# Patient Record
Sex: Female | Born: 1975 | Race: White | Hispanic: No | Marital: Married | Smoking: Never smoker
Health system: Southern US, Community
[De-identification: ages and names within clinical notes are randomized; demographics above are authoritative.]

## PROBLEM LIST (undated history)

## (undated) ENCOUNTER — Inpatient Hospital Stay (HOSPITAL_COMMUNITY): Payer: Self-pay

## (undated) DIAGNOSIS — IMO0002 Reserved for concepts with insufficient information to code with codable children: Secondary | ICD-10-CM

## (undated) DIAGNOSIS — O9902 Anemia complicating childbirth: Secondary | ICD-10-CM

## (undated) DIAGNOSIS — D649 Anemia, unspecified: Secondary | ICD-10-CM

## (undated) DIAGNOSIS — R87619 Unspecified abnormal cytological findings in specimens from cervix uteri: Secondary | ICD-10-CM

## (undated) HISTORY — PX: TUBAL LIGATION: SHX77

---
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
## 2003-11-06 HISTORY — PX: CRYOABLATION: SHX1415

## 2003-11-06 HISTORY — PX: COLPOSCOPY: SHX161

## 2009-05-25 ENCOUNTER — Inpatient Hospital Stay (HOSPITAL_COMMUNITY): Admission: AD | Admit: 2009-05-25 | Discharge: 2009-05-27 | Payer: Self-pay | Admitting: Obstetrics

## 2011-02-11 LAB — CBC
HCT: 38.9 % (ref 36.0–46.0)
Hemoglobin: 10.4 g/dL — ABNORMAL LOW (ref 12.0–15.0)
Hemoglobin: 13.8 g/dL (ref 12.0–15.0)
MCHC: 34.6 g/dL (ref 30.0–36.0)
MCV: 92.8 fL (ref 78.0–100.0)
RBC: 3.25 MIL/uL — ABNORMAL LOW (ref 3.87–5.11)
WBC: 11.1 10*3/uL — ABNORMAL HIGH (ref 4.0–10.5)
WBC: 16.1 10*3/uL — ABNORMAL HIGH (ref 4.0–10.5)

## 2011-02-11 LAB — RH IMMUNE GLOB WKUP(>/=20WKS)(NOT WOMEN'S HOSP): Fetal Screen: NEGATIVE

## 2011-03-20 NOTE — Op Note (Signed)
NAMEIMMACULATE, [name]              ACCOUNT NO.:  [id number]   MEDICAL RECORD NO.:  [id number]          PATIENT TYPE:  INP   LOCATION:  9141                          FACILITY:  [facility]   PHYSICIAN:  Lenoard Aden, M.D.DATE OF BIRTH:  11/17/1975   DATE OF PROCEDURE:  05/25/2009  DATE OF DISCHARGE:                               OPERATIVE REPORT   PREOPERATIVE DIAGNOSES:  1. Nonreassuring fetal heart rate tracing with persistent fetal      tachycardia, maternal fever.  2. Maternal exhaustion pushing x2 hours.   POSTOPERATIVE DIAGNOSES:  1. Nonreassuring fetal heart rate tracing with persistent fetal      tachycardia, maternal fever.  2. Maternal exhaustion pushing x2 hours.   PROCEDURE:  Outlet vacuum-assisted vaginal delivery with kiwi cup x2  pulls.  No pop offs.   SURGEON:  Lenoard Aden, MD   ANESTHESIA:  Epidural.   ESTIMATED BLOOD LOSS:  500 mL.   COMPLICATIONS:  None.   DRAINS:  None.   COUNTS:  Correct.  The patient's recovery in good condition.   FINDINGS:  Full-term living female 8 pounds 4 ounces, occiput anterior  position.  No nuchal cord and 3-vessel cord noted.  Placenta delivered  spontaneously intact.  Second-degree midline laceration repaired with a  3-0 Vicryl Rapide.   DESCRIPTION OF THE PROCEDURE:  After being apprised of the risks,  benefits of vacuum assistance include cephalohematoma, scalp laceration,  and intracranial hemorrhage, the kiwi cup was placed, fetal vertex +3 to  +4 station for 2 pulls.  No pop offs, full-term living female, occiput  anterior position, and left less than 45 degrees over second-degree  midline laceration, Apgars 8 and 9.  Cord blood collected.  Placenta  delivered spontaneously intact.  No cervical or vaginal extensions noted.  The repair with a 3-0 Vicryl  Rapide in a standard fashion.  Good hemostasis noted.  Estimated blood  loss as noted.  The patient and mother tolerated the procedure well.  Both recovering in  good condition.      Lenoard Aden, M.D.  Electronically Signed     RJT/MEDQ  D:  05/25/2009  T:  05/26/2009  Job:  [id number]

## 2012-01-29 LAB — OB RESULTS CONSOLE RUBELLA ANTIBODY, IGM: Rubella: IMMUNE

## 2012-01-29 LAB — OB RESULTS CONSOLE GC/CHLAMYDIA
Chlamydia: NEGATIVE
Gonorrhea: NEGATIVE

## 2012-02-18 ENCOUNTER — Other Ambulatory Visit (HOSPITAL_COMMUNITY): Payer: Self-pay | Admitting: Obstetrics

## 2012-02-18 DIAGNOSIS — IMO0001 Reserved for inherently not codable concepts without codable children: Secondary | ICD-10-CM

## 2012-02-19 ENCOUNTER — Encounter (HOSPITAL_COMMUNITY): Payer: Self-pay

## 2012-02-19 ENCOUNTER — Other Ambulatory Visit (HOSPITAL_COMMUNITY): Payer: Self-pay | Admitting: Obstetrics

## 2012-02-19 ENCOUNTER — Ambulatory Visit (HOSPITAL_COMMUNITY)
Admission: RE | Admit: 2012-02-19 | Discharge: 2012-02-19 | Disposition: A | Discharge status: Home / Self Care | Payer: 59 | Source: Ambulatory Visit | Attending: Obstetrics | Admitting: Obstetrics

## 2012-02-19 VITALS — BP 121/7 | Wt 131.0 lb

## 2012-02-19 DIAGNOSIS — IMO0001 Reserved for inherently not codable concepts without codable children: Secondary | ICD-10-CM

## 2012-02-19 DIAGNOSIS — O30009 Twin pregnancy, unspecified number of placenta and unspecified number of amniotic sacs, unspecified trimester: Secondary | ICD-10-CM | POA: Insufficient documentation

## 2012-02-19 DIAGNOSIS — O26849 Uterine size-date discrepancy, unspecified trimester: Secondary | ICD-10-CM | POA: Insufficient documentation

## 2012-02-19 DIAGNOSIS — O09529 Supervision of elderly multigravida, unspecified trimester: Secondary | ICD-10-CM | POA: Insufficient documentation

## 2012-03-04 ENCOUNTER — Ambulatory Visit (HOSPITAL_COMMUNITY)
Admission: RE | Admit: 2012-03-04 | Discharge: 2012-03-04 | Disposition: A | Discharge status: Home / Self Care | Payer: 59 | Source: Ambulatory Visit | Attending: Obstetrics | Admitting: Obstetrics

## 2012-03-04 DIAGNOSIS — O30009 Twin pregnancy, unspecified number of placenta and unspecified number of amniotic sacs, unspecified trimester: Secondary | ICD-10-CM | POA: Insufficient documentation

## 2012-03-04 DIAGNOSIS — O09529 Supervision of elderly multigravida, unspecified trimester: Secondary | ICD-10-CM | POA: Insufficient documentation

## 2012-03-04 DIAGNOSIS — IMO0001 Reserved for inherently not codable concepts without codable children: Secondary | ICD-10-CM

## 2012-03-04 DIAGNOSIS — O26849 Uterine size-date discrepancy, unspecified trimester: Secondary | ICD-10-CM | POA: Insufficient documentation

## 2012-03-18 ENCOUNTER — Other Ambulatory Visit (HOSPITAL_COMMUNITY): Payer: Self-pay | Admitting: Obstetrics

## 2012-03-18 DIAGNOSIS — O26849 Uterine size-date discrepancy, unspecified trimester: Secondary | ICD-10-CM

## 2012-03-18 DIAGNOSIS — O09529 Supervision of elderly multigravida, unspecified trimester: Secondary | ICD-10-CM

## 2012-03-18 DIAGNOSIS — O30009 Twin pregnancy, unspecified number of placenta and unspecified number of amniotic sacs, unspecified trimester: Secondary | ICD-10-CM

## 2012-03-19 ENCOUNTER — Other Ambulatory Visit (HOSPITAL_COMMUNITY): Payer: Self-pay | Admitting: Obstetrics

## 2012-03-19 ENCOUNTER — Ambulatory Visit (HOSPITAL_COMMUNITY)
Admission: RE | Admit: 2012-03-19 | Discharge: 2012-03-19 | Disposition: A | Discharge status: Home / Self Care | Payer: 59 | Source: Ambulatory Visit | Attending: Obstetrics | Admitting: Obstetrics

## 2012-03-19 DIAGNOSIS — O09529 Supervision of elderly multigravida, unspecified trimester: Secondary | ICD-10-CM

## 2012-03-19 DIAGNOSIS — O26849 Uterine size-date discrepancy, unspecified trimester: Secondary | ICD-10-CM

## 2012-03-19 DIAGNOSIS — O30009 Twin pregnancy, unspecified number of placenta and unspecified number of amniotic sacs, unspecified trimester: Secondary | ICD-10-CM | POA: Insufficient documentation

## 2012-04-02 ENCOUNTER — Ambulatory Visit (HOSPITAL_COMMUNITY)
Admission: RE | Admit: 2012-04-02 | Discharge: 2012-04-02 | Disposition: A | Discharge status: Home / Self Care | Payer: 59 | Source: Ambulatory Visit | Attending: Obstetrics | Admitting: Obstetrics

## 2012-04-02 ENCOUNTER — Other Ambulatory Visit (HOSPITAL_COMMUNITY): Payer: Self-pay | Admitting: Obstetrics

## 2012-04-02 VITALS — BP 113/75 | HR 104 | Wt 141.5 lb

## 2012-04-02 DIAGNOSIS — O09529 Supervision of elderly multigravida, unspecified trimester: Secondary | ICD-10-CM | POA: Insufficient documentation

## 2012-04-02 DIAGNOSIS — O26849 Uterine size-date discrepancy, unspecified trimester: Secondary | ICD-10-CM

## 2012-04-02 DIAGNOSIS — Z1389 Encounter for screening for other disorder: Secondary | ICD-10-CM | POA: Insufficient documentation

## 2012-04-02 DIAGNOSIS — O30009 Twin pregnancy, unspecified number of placenta and unspecified number of amniotic sacs, unspecified trimester: Secondary | ICD-10-CM | POA: Insufficient documentation

## 2012-04-02 DIAGNOSIS — Z363 Encounter for antenatal screening for malformations: Secondary | ICD-10-CM | POA: Insufficient documentation

## 2012-04-02 DIAGNOSIS — O358XX0 Maternal care for other (suspected) fetal abnormality and damage, not applicable or unspecified: Secondary | ICD-10-CM | POA: Insufficient documentation

## 2012-04-02 NOTE — Progress Notes (Signed)
Patient seen today  for follow up ultrasound.  See full report in [facility].  Alpha Gula, MD  Monochorionic/diamniotic twin pregnancy  with best dates of 18 +5 weeks Normal amniotic fluid volume x 2; no gross structural anomalies identified A thin, wispy separating membrane is noted  Twin A: Interval growth is appropriate.  Normal anatomic fetal survey. Normal amniotic fluid volume.  No markers for aneuploidy noted.  A fetal bladder was appreciated.  Twin B: A 2-3 week growth lag is appreciated.  Limited views of the fetal heart were noted.  No obvious abnormalies were appreciated.  A fetal bladder is visible.  A marginal cord insertion is appreciated.  No evidence of TTTS noted on today's ultrasound - feel that growth discordance is most likely due to placental insufficiency vs. genetic etiology (chromosomal/ syndromic).  The patient met briefly with our genetics counselor - undecided about cell free fetal DNA testing.  Recommend follow up in 2 weeks - will reevaluate the fetal heart anatomy in Twin B at that time.

## 2012-04-18 ENCOUNTER — Ambulatory Visit (HOSPITAL_COMMUNITY)
Admission: RE | Admit: 2012-04-18 | Discharge: 2012-04-18 | Disposition: A | Discharge status: Home / Self Care | Payer: 59 | Source: Ambulatory Visit | Attending: Obstetrics | Admitting: Obstetrics

## 2012-04-18 VITALS — BP 113/71 | HR 104 | Wt 141.5 lb

## 2012-04-18 DIAGNOSIS — O09529 Supervision of elderly multigravida, unspecified trimester: Secondary | ICD-10-CM | POA: Insufficient documentation

## 2012-04-18 DIAGNOSIS — O26849 Uterine size-date discrepancy, unspecified trimester: Secondary | ICD-10-CM | POA: Insufficient documentation

## 2012-04-18 DIAGNOSIS — O30009 Twin pregnancy, unspecified number of placenta and unspecified number of amniotic sacs, unspecified trimester: Secondary | ICD-10-CM | POA: Insufficient documentation

## 2012-04-18 NOTE — Progress Notes (Signed)
Patient seen today  for follow up ultrasound.  See full report in [facility].  Alpha Gula, MD  Monochorionic/diamniotic twin pregnancy  with best dates of 21 0/7 weeks Normal amniotic fluid volume x 2; no gross structural anomalies identified A thin, wispy separating membrane is noted.  Twin A: Interval growth is appropriate (48th %).  Normal anatomic fetal survey. Normal amniotic fluid volume.  Mild right-sided renal pylectasis is noted.  A fetal bladder was appreciated.  Twin B: A 3-4 week growth lag is appreciated- EFW < 10th%, but some growth noted since previous evaluation.  Somewhat limited views of the fetal heart were noted.  No obvious abnormalies were appreciated.  A fetal bladder is visible.  A marginal cord insertion is appreciated.  No evidence of TTTS noted on today's ultrasound - feel that growth discordance is most likely due to placental insufficiency vs. genetic etiology (chromosomal/ syndromic).  The patient will most likely decline cell free fetal DNA testing.  Recommend follow up in 2 weeks.

## 2012-05-02 ENCOUNTER — Encounter (HOSPITAL_COMMUNITY): Payer: Self-pay

## 2012-05-02 ENCOUNTER — Ambulatory Visit (HOSPITAL_COMMUNITY)
Admission: RE | Admit: 2012-05-02 | Discharge: 2012-05-02 | Disposition: A | Discharge status: Home / Self Care | Payer: 59 | Source: Ambulatory Visit | Attending: Obstetrics | Admitting: Obstetrics

## 2012-05-02 VITALS — BP 127/70 | HR 95 | Wt 144.0 lb

## 2012-05-02 DIAGNOSIS — O09529 Supervision of elderly multigravida, unspecified trimester: Secondary | ICD-10-CM | POA: Insufficient documentation

## 2012-05-02 DIAGNOSIS — O30009 Twin pregnancy, unspecified number of placenta and unspecified number of amniotic sacs, unspecified trimester: Secondary | ICD-10-CM | POA: Insufficient documentation

## 2012-05-02 DIAGNOSIS — O365990 Maternal care for other known or suspected poor fetal growth, unspecified trimester, not applicable or unspecified: Secondary | ICD-10-CM

## 2012-05-02 DIAGNOSIS — O26849 Uterine size-date discrepancy, unspecified trimester: Secondary | ICD-10-CM | POA: Insufficient documentation

## 2012-05-16 ENCOUNTER — Other Ambulatory Visit (HOSPITAL_COMMUNITY): Payer: Self-pay | Admitting: Maternal and Fetal Medicine

## 2012-05-16 ENCOUNTER — Other Ambulatory Visit: Payer: Self-pay | Admitting: Obstetrics

## 2012-05-16 ENCOUNTER — Ambulatory Visit (HOSPITAL_COMMUNITY)
Admission: RE | Admit: 2012-05-16 | Discharge: 2012-05-16 | Disposition: A | Discharge status: Home / Self Care | Payer: 59 | Source: Ambulatory Visit | Attending: Obstetrics | Admitting: Obstetrics

## 2012-05-16 VITALS — BP 119/73 | HR 92 | Wt 151.0 lb

## 2012-05-16 DIAGNOSIS — O365990 Maternal care for other known or suspected poor fetal growth, unspecified trimester, not applicable or unspecified: Secondary | ICD-10-CM

## 2012-05-16 DIAGNOSIS — O30009 Twin pregnancy, unspecified number of placenta and unspecified number of amniotic sacs, unspecified trimester: Secondary | ICD-10-CM

## 2012-05-16 DIAGNOSIS — O26849 Uterine size-date discrepancy, unspecified trimester: Secondary | ICD-10-CM

## 2012-05-16 DIAGNOSIS — O09529 Supervision of elderly multigravida, unspecified trimester: Secondary | ICD-10-CM | POA: Insufficient documentation

## 2012-05-16 NOTE — Progress Notes (Signed)
Patient seen today  for follow up ultrasound.  See full report in [facility].  Alpha Gula, MD  Ms. [name] returns for follow up due to MC/DA twins with severe growth restriction of Twin B.  No anomalies have been identified on Twin B to date, but suspicion is that this may represent a chromosomal / genetic syndrome due to very early growth restriction that was noted as early at [redacted] weeks gestation.  Uteroplacental insufficiency is less likely.  The patient previously declined cell free fetal DNA testing.  We had a long discussion regarding management now that at least one twin is potentially viable.  At this point, with Twin B's estimated fetal weight well-below 500 g it's ability to survive outside of the womb is unlikely.  In the event that Twin B where to expire in-utereo, Twin A would be at significant risk.  She was quoted a 15% risk for demise of twin A, and a 35% risk for abnormal cranial imaging/ neurodevelopmental delays in the event that Twin B were to expire.  This risk occurs almost instantaneously - would not be able to wait for demise of Twin B to move for delivery of Twin A to avoid neurologic insult in the surviving twin.  I explained to the couple that they may be in a position in the future that they would have to decide to move toward delivery for the benefit of Twin A even if it meant almost the certain neonatal demise of Twin B.    Monochorionic/diamniotic twin pregnancy at 25+0 weeks   Amniotic fluid volume subjectively normal x 2 56% growth discordance noted  Twin A: EFW at the 57th %tile.  Normal umbilical artery Doppler studies. Normal interval anatomy  Twin B: EFW < 10th %tile (341 g)  for Twin B; over the past 14 days, gained approximately 100 grams. Elevated umbilical artery Doppler studies noted without evidence of absent or reversed diastolic flow The fetus is active Limited views of the fetal heart were again obtained.  1) Recommend betamethsone series for fetal  lung maturity 2) Contacted Neonatology - recommend consult (outpatient would be preferred)  RE: discuss likelihood of survival at current weights/ gestational age - to assist in determining at what gestational age they would like intervention for Twin A in the event that Twin B were moribund. 3) Will begin 2x weekly Doppler studies in this office.  In the event that absent or reversed flow is noted on Twin B, would recommend admission for continuous monitoring and possible delivery for the benefit of Twin A.

## 2012-05-17 ENCOUNTER — Other Ambulatory Visit (HOSPITAL_COMMUNITY): Payer: Self-pay | Admitting: Maternal and Fetal Medicine

## 2012-05-17 ENCOUNTER — Inpatient Hospital Stay (HOSPITAL_COMMUNITY)
Admission: AD | Admit: 2012-05-17 | Discharge: 2012-05-17 | Disposition: A | Discharge status: Home / Self Care | Payer: 59 | Source: Ambulatory Visit | Attending: Obstetrics | Admitting: Obstetrics

## 2012-05-17 DIAGNOSIS — O4700 False labor before 37 completed weeks of gestation, unspecified trimester: Secondary | ICD-10-CM | POA: Insufficient documentation

## 2012-05-17 DIAGNOSIS — O30009 Twin pregnancy, unspecified number of placenta and unspecified number of amniotic sacs, unspecified trimester: Secondary | ICD-10-CM

## 2012-05-17 DIAGNOSIS — O365990 Maternal care for other known or suspected poor fetal growth, unspecified trimester, not applicable or unspecified: Secondary | ICD-10-CM

## 2012-05-17 MED ORDER — BETAMETHASONE SOD PHOS & ACET 6 (3-3) MG/ML IJ SUSP
12.0000 mg | Freq: Once | INTRAMUSCULAR | Status: AC
  Administered 2012-05-17: 12 mg via INTRAMUSCULAR
  Filled 2012-05-17: qty 2

## 2012-05-20 ENCOUNTER — Ambulatory Visit (HOSPITAL_COMMUNITY)
Admission: RE | Admit: 2012-05-20 | Discharge: 2012-05-20 | Disposition: A | Discharge status: Home / Self Care | Payer: 59 | Source: Ambulatory Visit | Attending: Obstetrics | Admitting: Obstetrics

## 2012-05-20 DIAGNOSIS — O30009 Twin pregnancy, unspecified number of placenta and unspecified number of amniotic sacs, unspecified trimester: Secondary | ICD-10-CM | POA: Insufficient documentation

## 2012-05-20 DIAGNOSIS — O365990 Maternal care for other known or suspected poor fetal growth, unspecified trimester, not applicable or unspecified: Secondary | ICD-10-CM

## 2012-05-20 DIAGNOSIS — O26849 Uterine size-date discrepancy, unspecified trimester: Secondary | ICD-10-CM | POA: Insufficient documentation

## 2012-05-20 DIAGNOSIS — O09529 Supervision of elderly multigravida, unspecified trimester: Secondary | ICD-10-CM | POA: Insufficient documentation

## 2012-05-20 NOTE — Progress Notes (Signed)
Patient seen for follow up for Doppler studies.  See report in [facility].  Alpha Gula, MD  Monochorionic/diamniotic twin pregnancy at 25+4 weeks   Amniotic fluid volume subjectively normal x 2  Twin A: Fetus is active. Normal umbilical artery Doppler studies.   Twin B: Elevated umbilical artery Doppler studies noted without evidence of absent or reversed diastolic flow The fetus is active  Continue 2x weekly umbilical artery Doppler studies.  Follow up growth scan in 2 weeks. Patient to meet with NICU for counseling Would recommend admission should Twin B develop absent end-diastolic flow - will continue outpatient observation

## 2012-05-22 ENCOUNTER — Other Ambulatory Visit (HOSPITAL_COMMUNITY): Payer: Self-pay | Admitting: Obstetrics

## 2012-05-22 DIAGNOSIS — O30009 Twin pregnancy, unspecified number of placenta and unspecified number of amniotic sacs, unspecified trimester: Secondary | ICD-10-CM

## 2012-05-23 ENCOUNTER — Encounter (HOSPITAL_COMMUNITY): Payer: Self-pay | Admitting: *Deleted

## 2012-05-23 ENCOUNTER — Ambulatory Visit (HOSPITAL_COMMUNITY)
Admission: RE | Admit: 2012-05-23 | Discharge: 2012-05-23 | Disposition: A | Discharge status: Home / Self Care | Payer: 59 | Source: Ambulatory Visit | Attending: Obstetrics | Admitting: Obstetrics

## 2012-05-23 ENCOUNTER — Inpatient Hospital Stay (HOSPITAL_COMMUNITY)
Admission: AD | Admit: 2012-05-23 | Discharge: 2012-05-30 | DRG: 782 | Disposition: A | Discharge status: Home / Self Care | Payer: 59 | Source: Ambulatory Visit | Attending: Obstetrics and Gynecology | Admitting: Obstetrics and Gynecology

## 2012-05-23 DIAGNOSIS — O09529 Supervision of elderly multigravida, unspecified trimester: Secondary | ICD-10-CM | POA: Insufficient documentation

## 2012-05-23 DIAGNOSIS — O26849 Uterine size-date discrepancy, unspecified trimester: Secondary | ICD-10-CM | POA: Insufficient documentation

## 2012-05-23 DIAGNOSIS — O30039 Twin pregnancy, monochorionic/diamniotic, unspecified trimester: Secondary | ICD-10-CM | POA: Insufficient documentation

## 2012-05-23 DIAGNOSIS — O30009 Twin pregnancy, unspecified number of placenta and unspecified number of amniotic sacs, unspecified trimester: Secondary | ICD-10-CM | POA: Insufficient documentation

## 2012-05-23 DIAGNOSIS — O365990 Maternal care for other known or suspected poor fetal growth, unspecified trimester, not applicable or unspecified: Principal | ICD-10-CM | POA: Diagnosis present

## 2012-05-23 DIAGNOSIS — IMO0002 Reserved for concepts with insufficient information to code with codable children: Secondary | ICD-10-CM | POA: Diagnosis present

## 2012-05-23 HISTORY — DX: Reserved for concepts with insufficient information to code with codable children: IMO0002

## 2012-05-23 HISTORY — DX: Unspecified abnormal cytological findings in specimens from cervix uteri: R87.619

## 2012-05-23 HISTORY — DX: Anemia, unspecified: D64.9

## 2012-05-23 LAB — CBC
HCT: 35.2 % — ABNORMAL LOW (ref 36.0–46.0)
MCH: 30.1 pg (ref 26.0–34.0)
MCV: 89.8 fL (ref 78.0–100.0)
RDW: 12.6 % (ref 11.5–15.5)
WBC: 9.8 10*3/uL (ref 4.0–10.5)

## 2012-05-23 LAB — DIFFERENTIAL
Basophils Absolute: 0 10*3/uL (ref 0.0–0.1)
Eosinophils Absolute: 0.1 10*3/uL (ref 0.0–0.7)
Eosinophils Relative: 1 % (ref 0–5)
Lymphocytes Relative: 15 % (ref 12–46)
Lymphs Abs: 1.5 10*3/uL (ref 0.7–4.0)
Monocytes Absolute: 0.8 10*3/uL (ref 0.1–1.0)

## 2012-05-23 LAB — ABO/RH: ABO/RH(D): O NEG

## 2012-05-23 MED ORDER — ZOLPIDEM TARTRATE 5 MG PO TABS
5.0000 mg | ORAL_TABLET | Freq: Every evening | ORAL | Status: DC | PRN
  Administered 2012-05-23: 5 mg via ORAL
  Filled 2012-05-23: qty 1

## 2012-05-23 MED ORDER — DOCUSATE SODIUM 100 MG PO CAPS
100.0000 mg | ORAL_CAPSULE | Freq: Every day | ORAL | Status: DC
  Administered 2012-05-23 – 2012-05-30 (×8): 100 mg via ORAL
  Filled 2012-05-23 (×9): qty 1

## 2012-05-23 MED ORDER — PRENATAL MULTIVITAMIN CH
1.0000 | ORAL_TABLET | Freq: Every day | ORAL | Status: DC
  Administered 2012-05-24 – 2012-05-30 (×7): 1 via ORAL
  Filled 2012-05-23 (×7): qty 1

## 2012-05-23 MED ORDER — ACETAMINOPHEN 325 MG PO TABS: 650.0000 mg | ORAL_TABLET | ORAL | Status: DC | PRN

## 2012-05-23 MED ORDER — CALCIUM CARBONATE ANTACID 500 MG PO CHEW: 2.0000 | CHEWABLE_TABLET | ORAL | Status: DC | PRN

## 2012-05-23 MED ORDER — SODIUM CHLORIDE 0.9 % IJ SOLN: 3.0000 mL | Freq: Two times a day (BID) | INTRAMUSCULAR | Status: DC

## 2012-05-23 NOTE — ED Notes (Signed)
Antenatal charge nurse notified of admission.  Pt taken to MAU desk for registration.

## 2012-05-23 NOTE — Progress Notes (Signed)
Patient seen for follow up Doppler studies.  See report in [facility].  Alpha Gula, MD  Monochorionic/diamniotic twin pregnancy at 58 0/7 weeks   Amniotic fluid volume subjectively normal x 2  Twin A: Fetus is active. Normal umbilical artery Doppler studies.   Twin B: Absent end-diastolic flow noted on umbilical artery Doppler studies; no evidence of reversed flow The fetus is active  Recommend admission - NICU consult, daily umbilical artery Doppler studies. Patient has completed course of betamethasone for fetal lung maturity Would move toward delivery if Twin B shows evidence of reversed diastolic flow on umbilical artery Dopplers.    Findings were discussed with Dr. Billy Coast - plan admission to antepartum unit.

## 2012-05-23 NOTE — H&P (Signed)
[name], [name]              ACCOUNT NO.:  [id number]  MEDICAL RECORD NO.:  [id number]  LOCATION:  9151                          FACILITY:  [facility]  PHYSICIAN:  Lenoard Aden, M.D.DATE OF BIRTH:  July 21, 1976  DATE OF ADMISSION:  05/23/2012 DATE OF DISCHARGE:                             HISTORY & PHYSICAL   CHIEF COMPLAINT:  Twin pregnancy with absent end-diastolic flow and severe IUGR of twin B.  HISTORY OF PRESENT ILLNESS:  A 36 year old white female, G3, P1 with history of vacuum-assisted vaginal delivery x1 with known monochorionic diamniotic twins, now with absent end-diastolic flow and severe IUGR of B for admission, continuous monitoring, and daily Doppler flow studies.  ALLERGIES:  She has no known drug allergies.  MEDICATIONS:  Prenatal vitamins.  SOCIAL HISTORY:  She is a nonsmoker, nondrinker.  She denies domestic or physical violence.  FAMILY HISTORY:  Diabetes, heart disease, chronic hypertension, colon cancer.  She has a previous history of vaginal delivery and TAB in 1994.  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  Prenatal course complicated by monochorionic diamniotic twins with poor growth of B, and now new-onset absent end-diastolic flow.  No reversal noted per MFM, Dr. Harlon Flor.  He recommends admission at this time.  PHYSICAL EXAMINATION:  GENERAL:  She is a well-developed, well- nourished, white female, in no acute distress.  Height of 64 inches, weight of 152 pounds. HEENT:  Normal. NECK:  Supple.  Full range of motion. LUNGS:  Clear to auscultation. ABDOMEN:  Soft, gravid, nontender.  No CVA tenderness. EXTREMITIES:  No cords. NEUROLOGIC:  Nonfocal. SKIN:  Intact. PELVIC:  Deferred.  NSTs pending.  Doppler flow studies were performed today by MFM as noted.  IMPRESSION:  A 26-week twin, monochorionic diamniotic gestation with absent end-diastolic flow and severe IUGR of twin B.  PLAN:  We  will be admit, continuous monitoring, daily Dopplers studies. We will consider possibly not at this time, though magnesium for neuro prophylaxis.  The patient is betamethasone complete.  Daily consultation with MFM is discussed.  NICU consult at this time.  The patient acknowledges the plan and the possibility that early delivery will potentially involve the demise of twin B to potentially increase survival chances of twin A.  Delivery likely to be by C-section.  The patient acknowledges these risks, benefits and she will proceed.     Lenoard Aden, M.D.     RJT/MEDQ  D:  05/23/2012  T:  05/23/2012  Job:  [id number]

## 2012-05-23 NOTE — Progress Notes (Signed)
UR Chart review completed.  

## 2012-05-23 NOTE — Progress Notes (Signed)
[name] is a 36 y.o. G3P1011 at [redacted]w[redacted]d by LMP admitted for Severe IUGR and AEDF on Twin B with Mono C Di A twin gestation.  Subjective: No complaints.  Objective: BP 128/85  Pulse 97  Temp 98.6 F (37 C) (Oral)  Resp 18  Ht 5\' 4"  (1.626 m)  Wt 69.854 kg (154 lb)  BMI 26.43 kg/m2  LMP 11/23/2011    See Dictated H&P  FHT:  pending UC:   pending SVE:    na  Labs: pending  Assessment / Plan: 26 week Mono C Di A IUP Severe IUGR with AEDF on UAD Twin B  Labor: na Preeclampsia:  na Fetal Wellbeing:  na Pain Control:  na I/D:  n/a Anticipated MOD:  Csection for Delivery. Will deliver for reversal EDF Discussed with Dr. Claudean Severance (MFM) NICU consult today.  Robben Jagiello J 05/23/2012, 11:04 AM

## 2012-05-23 NOTE — Consult Note (Signed)
Neonatology Consult Note:  I was asked by Dr. Billy Coast to met with Mrs. Corral and her husband.  Mrs. Chowning is at 26 wks currently with preg complicated by mon/di twins with severe IUGR for twin B (estimated weight 400g) while twin A is estimated at 700g.  Pt admitted today due to elevated dopplers.  Has received BMZ 7/12 and 7/13.  We discussed morbidity/mortality at this gestional age.  While national mortality at this gestation age is 80-85% we discussed that due to severe IUGR, baby B will have a much lower chance of survival that will in part be dictated by the ability to place an endotracheal tube due to the baby's limited weight.  We also discussed the higher mortality / morbidity for a severely growth restricted baby in the NICU.   We delivery room resuscitation, including intubation and surfactant in DR.  Discussed mechanical ventilation and risk for chronic lung disease, risk for IVH with potential for motor / cognitive deficits, ROP, NEC, sepsis, as well as temperature instability and feeding immaturity.  Discussed NG / OG feeds, benefits of MBM in reducing incidence of NEC.   Discussed likely length of stay. Thank you for allowing [place] to participate in her care.  Please call with questions.  John Giovanni, DO  Neonatologist

## 2012-05-24 ENCOUNTER — Inpatient Hospital Stay (HOSPITAL_COMMUNITY): Payer: 59

## 2012-05-24 DIAGNOSIS — O30039 Twin pregnancy, monochorionic/diamniotic, unspecified trimester: Secondary | ICD-10-CM | POA: Diagnosis present

## 2012-05-24 DIAGNOSIS — IMO0002 Reserved for concepts with insufficient information to code with codable children: Secondary | ICD-10-CM | POA: Diagnosis present

## 2012-05-24 NOTE — Progress Notes (Signed)
MFM note  Ultrasound images reviewed - AEDF noted on twin B but no evidence of reversed flow.  Twin A - within normal limits. No events over night  No decelerations for either Twin A or B on continuous monitoring  Recommend continued daily Doppler studies, fetal monitoring  Maria Gula, MD

## 2012-05-24 NOTE — Progress Notes (Signed)
Patient ID: [name], female   DOB: 1975-12-05, 35 y.o.   MRN: [id number] HD#[id number] 26 1/7 weeks Mono C Di A twin gestation  S: No complaints. No contractions , bleeding or LOF Good FM No CP or SOB  O: BP 126/79  Pulse 100  Temp 97.9 F (36.6 C) (Oral)  Resp 20  Ht 5\' 4"  (1.626 m)  Wt 69.854 kg (154 lb)  BMI 26.43 kg/m2  LMP 11/23/2011  Lgs: CTA CV: RRR ABD: Soft , gravid , NT Neg CVAT EXT: Neg c/c/e Skin: intact Neuro: nonfocal VE: deferred  FHTs 150-160 good BTBV, No decels for A and B Preliminary report today = AEDF on B with no evidence of reversal  A: 26 1/7 weeks Mono Chorionic Di Amniotic Twin Gestation Severe IUGR with AEDF on UAD Twin B  P: NST x 2 hrs q shift Daily UAD Delivery for reversal EDF B.

## 2012-05-25 ENCOUNTER — Inpatient Hospital Stay (HOSPITAL_COMMUNITY): Payer: 59

## 2012-05-25 NOTE — Progress Notes (Signed)
Patient ID: NATAKI MCCRUMB, female   DOB: 03/31/1976, 36 y.o.   MRN: [id number] HD#3  26 2/7 weeks Mono C Di A twin gestation   S:  No complaints.  No contractions , bleeding or LOF  Good FM  No CP or SOB   O:  BP 122/70  Pulse 90  Temp 97.8 F (36.6 C) (Oral)  Resp 18  Ht 5\' 4"  (1.626 m)  Wt 69.854 kg (154 lb)  BMI 26.43 kg/m2  LMP 11/23/2011   Head: NCAT Neck: supple with FROM  Lgs: CTA  CV: RRR  ABD: Soft , gravid , NT  Neg CVAT  EXT: Neg c/c/e  Skin: intact  Neuro: nonfocal  VE: deferred   FHTs 140-160 good BTBV, No decels for A and B  Preliminary report today = AEDF on B with no evidence of reversal  Nl UAD twin A  A:  26 2/7 weeks  Mono Chorionic Di Amniotic Twin Gestation  Severe IUGR with AEDF on UAD Twin B   P:  NST x 2 hrs q shift  Daily UAD  Sono for interval growth on 7/26 Delivery for reversal EDF B.

## 2012-05-25 NOTE — Progress Notes (Signed)
MFM note  No events overnight.  Both fetuses active. No decelerations on fetal monitoring for Twin A or B  Reviewed ultrasound images -  Intermittently AEDF on umbilical artery Dopplers on Twin B Normal umbilical artery Dopplers on Twin A  A/P: MC/DA twins at 26 2/7 weeks Severe IUGR of Twin B (EFW approx 400 g by ultrasound last week) - now with AEDF on umbilical artery Dopplers s/p course of betamethasone  - Continue daily Doppler studies  - Follow up growth scan on 7/26  Alpha Gula, MD

## 2012-05-25 NOTE — Progress Notes (Signed)
RN at bedside readjusting cardio after pt moving in the bed.  Lots of FM felt by pt as well as Charity fundraiser while at bedside

## 2012-05-26 ENCOUNTER — Inpatient Hospital Stay (HOSPITAL_COMMUNITY)
Admit: 2012-05-26 | Discharge: 2012-05-26 | Disposition: A | Discharge status: Home / Self Care | Payer: 59 | Attending: Obstetrics | Admitting: Obstetrics

## 2012-05-26 NOTE — Progress Notes (Signed)
Patient ID: [name], female   DOB: February 17, 1976, 36 y.o.   MRN: [id number] HD#4  26 3/7 weeks Mono C Di A twin gestation   S:  No complaints.  No contractions , bleeding or LOF  Good FM - both fetuses No CP or SOB   O: BP 133/78  Pulse 95  Temp 98.3 F (36.8 C) (Oral)  Resp 18  Ht 5\' 4"  (1.626 m)  Wt 69.854 kg (154 lb)  BMI 26.43 kg/m2  LMP 11/23/2011   Head: NCAT  Neck: supple with FROM  Lgs: CTA  CV: RRR  ABD: Soft , gravid , NT  Neg CVAT  EXT: Neg c/c/e  Skin: intact  Neuro: nonfocal  VE: deferred   FHTs 140-160 good BTBV, No decels for A and B  Preliminary report today = AEDF on B with no evidence of reversal  Nl UAD twin A   A:  26 3/7 weeks  Mono Chorionic Di Amniotic Twin Gestation  Severe IUGR with AEDF(no reversal) on UAD Twin B   P:  NST x 2 hrs q shift  Daily UAD  Sono for interval growth on 7/26  Delivery for reversal EDF B.  Recommend 1hrgtt later this week Will need Rhogam at 28 weeks.

## 2012-05-27 ENCOUNTER — Inpatient Hospital Stay (HOSPITAL_COMMUNITY)
Admission: RE | Admit: 2012-05-27 | Discharge: 2012-05-27 | Disposition: A | Discharge status: Home / Self Care | Payer: 59 | Source: Ambulatory Visit | Attending: Obstetrics | Admitting: Obstetrics

## 2012-05-27 NOTE — Progress Notes (Signed)
Patient ID: [name], female   DOB: [birth year], 36 y.o.   MRN: [id number] Stable UAD  A and B today. Continue daily UAD.

## 2012-05-27 NOTE — Progress Notes (Signed)
[name] was seen for ultrasound appointment today.  Please see [facility] report for details.  

## 2012-05-27 NOTE — Progress Notes (Signed)
Patient ID: [name], female   DOB: [birth year], 36 y.o.   MRN: [id number] HD#[id number]  26 4/7 weeks Mono C Di A twin gestation   S:  No complaints.  No contractions , bleeding or LOF  Good FM - both fetuses  No CP or SOB   O:  BP 130/67  Pulse 95  Temp 98.2 F (36.8 C) (Oral)  Resp 20  Ht 5\' 4"  (1.626 m)  Wt 69.854 kg (154 lb)  BMI 26.43 kg/m2  LMP 11/23/2011   Head: NCAT  Neck: supple with FROM  Lgs: CTA  CV: RRR  ABD: Soft , gravid , NT  Neg CVAT  EXT: Neg c/c/e  Skin: intact  Neuro: nonfocal  VE: deferred   FHTs 140-160 good BTBV, No decels for A and B  Reassuring NST x 3 UAD studies pending today.  A:  26 4/7 weeks  Mono Chorionic Di Amniotic Twin Gestation  Severe IUGR with AEDF(no reversal) on UAD Twin B   P:  NST x 2 hrs q shift  Daily UAD  Sono for interval growth on 7/26  Delivery for reversal EDF B.  Recommend 1hrgtt later this week  Will do Rhogam at 28 weeks

## 2012-05-27 NOTE — Progress Notes (Signed)
Patient ID: [name], female   DOB: [birth year], 36 y.o.   MRN: [id number] HD#[id number]  26 5/7 weeks Mono C Di A twin gestation   S:  No complaints.  No contractions , bleeding or LOF  Good FM - both fetuses  No CP or SOB   O:  BP 121/73  Pulse 105  Temp 98.2 F (36.8 C) (Oral)  Resp 20  Ht 5\' 4"  (1.626 m)  Wt 69.854 kg (154 lb)  BMI 26.43 kg/m2  LMP 11/23/2011   Head: NCAT  Neck: supple with FROM  Lgs: CTA  CV: RRR  ABD: Soft , gravid , NT  Neg CVAT  EXT: Neg c/c/e  Skin: intact  Neuro: nonfocal  VE: deferred   FHTs 140-160 good BTBV, No decels for A and B  Reassuring NST x 3 (Category 1 tracing) UAD studies today- Nl UAD Fetus A and No AEDF or reversal on B.   A:  26 5/7 weeks  Mono Chorionic Di Amniotic Twin Gestation  Severe IUGR with AEDF(no reversal) on UAD Twin B   P:  NST x 2 hrs q shift  Daily UAD  Sono for interval growth tomorrow Delivery for reversal EDF B.  Recommend 1hrgtt Friday Will do Rhogam at 28 weeks

## 2012-05-27 NOTE — Progress Notes (Signed)
UR Chart review completed.  

## 2012-05-27 NOTE — Progress Notes (Signed)
Patient ID: [name], female   DOB: 1976-04-29, 36 y.o.   MRN: [id number] Tracings reviewed today. Reassuring tracings A and B. No prolonged decelerations noted. BTBV 5-25, no contractions. Category 1 tracing for A and B.

## 2012-05-28 ENCOUNTER — Inpatient Hospital Stay (HOSPITAL_COMMUNITY)
Admission: RE | Admit: 2012-05-28 | Discharge: 2012-05-28 | Disposition: A | Discharge status: Home / Self Care | Payer: 59 | Source: Ambulatory Visit | Attending: Obstetrics and Gynecology | Admitting: Obstetrics and Gynecology

## 2012-05-29 ENCOUNTER — Inpatient Hospital Stay (HOSPITAL_COMMUNITY)
Admission: RE | Admit: 2012-05-29 | Discharge: 2012-05-29 | Disposition: A | Discharge status: Home / Self Care | Payer: 59 | Source: Ambulatory Visit | Attending: Obstetrics | Admitting: Obstetrics

## 2012-05-29 NOTE — Progress Notes (Signed)
Patient ID: [name], female   DOB: [birth year], 36 y.o.   MRN: [id number] Sono reviewed. Appropriate interval growth Twin A and B. Persistent IUGR B. Elevated UAD with no evidence of AEDF or reversal. Will discuss with MFM pending UAD tomorrow.

## 2012-05-29 NOTE — Progress Notes (Signed)
Patient ID: [name], female   DOB: 1976-08-30, 36 y.o.   MRN: [id number] HD#[id number]  26 6/7 weeks Mono C Di A twin gestation   S:  No complaints.  No contractions , bleeding or LOF  Good FM - both fetuses  No CP or SOB   O:  BP 107/62  Pulse 93  Temp 98.3 F (36.8 C) (Oral)  Resp 18  Ht 5\' 4"  (1.626 m)  Wt 68.266 kg (150 lb 8 oz)  BMI 25.83 kg/m2  LMP 11/23/2011   Head: NCAT  Neck: supple with FROM  Lgs: CTA  CV: RRR  ABD: Soft , gravid , NT  Neg CVAT  EXT: Neg c/c/e  Skin: intact  Neuro: nonfocal  VE: deferred   FHTs 130-165 good BTBV, No decels for A and B  Reassuring NST (A&B) x 3 (Category 1 tracing)  Sono pending today for UAD and growth  A:  26 6/7 weeks  Mono Chorionic Di Amniotic Twin Gestation  Severe IUGR with AEDF(no reversal) on UAD Twin B (slight improvement yesterday)  P:  NST x 2 hrs q shift  Daily UAD  Sono for interval growth   Delivery for reversal EDF B.  Recommend 1hrgtt Friday  Will do Rhogam at 28 weeks

## 2012-05-30 ENCOUNTER — Ambulatory Visit (HOSPITAL_COMMUNITY): Payer: 59

## 2012-05-30 ENCOUNTER — Ambulatory Visit (HOSPITAL_COMMUNITY)
Admission: RE | Admit: 2012-05-30 | Discharge: 2012-05-30 | Disposition: A | Discharge status: Home / Self Care | Payer: 59 | Source: Ambulatory Visit | Attending: Obstetrics | Admitting: Obstetrics

## 2012-05-30 NOTE — Progress Notes (Signed)
[name] was seen for ultrasound appointment today.  Please see [facility] report for details.  

## 2012-05-30 NOTE — Progress Notes (Signed)
Patient ID: [name], female   DOB: 10/25/1976, 36 y.o.   MRN: [id number]  HD#[id number] 27 0/7 weeks Mono C Di A twin gestation   S:  No complaints.  No contractions , bleeding or LOF  Good FM - both fetuses active No SOB or CP  O:BP 114/66  Pulse 105  Temp 98.2 F (36.8 C) (Oral)  Resp 18  Ht 5\' 4"  (1.626 m)  Wt 68.266 kg (150 lb 8 oz)  BMI 25.83 kg/m2  LMP 11/23/2011   Head: NCAT  Neck: supple with FROM  Lgs: CTA  CV: RRR  ABD: Soft , gravid , NT  Neg CVAT  EXT: Neg c/c/e  Skin: intact  Neuro: nonfocal  VE: deferred   FHTs 130-165 good BTBV, No decels for A and B  Reassuring NST (A&B) x 3 (Category 1 tracing)  Sono pending today for UAD  A:  27 07 weeks  Mono Chorionic Di Amniotic Twin Gestation  Severe IUGR with AEDF(no reversal) on UAD Twin B (slight improvement yesterday)   P:  NST x 2 hrs q shift  Daily UAD  Sono for UAD Delivery for reversal EDF B.  1hrgtt today Will do rhogam at 28 weeks

## 2012-05-30 NOTE — Progress Notes (Signed)
I visited with pt while making rounds on unit.  She was in good spirits, though she reported that she had been up and down emotionally over the last week.  They are looking forward to hopefully going home over the weekend and spend time with their 35 year-old daughter.  [name] is also nervous to leave the hospital because she feels safer here.  I offered compassionate listening.    [address] [place] Pager, [phone] 12:02 PM   05/30/12 1100  Clinical Encounter Type  Visited With Patient and family together  Visit Type Initial

## 2012-05-30 NOTE — Progress Notes (Signed)
Patient ID: [name], female   DOB: 1976/06/09, 36 y.o.   MRN: [id number] UADs stable with no AEDF twin B. FHR tracing stable- Category 1. Will dc home as noted and continue on bedrest. Fu MFM for UAD M/W/F and BPP 2x per week. Office visit for Rhogam and 3hr gtt next week. Pt aware and concurs with plan.

## 2012-05-31 ENCOUNTER — Other Ambulatory Visit (HOSPITAL_COMMUNITY): Payer: 59

## 2012-05-31 NOTE — Discharge Summary (Signed)
[name], [name]              ACCOUNT NO.:  [id number]  MEDICAL RECORD NO.:  [id number]  LOCATION:  9151                          FACILITY:  [facility]  PHYSICIAN:  Lenoard Aden, M.D.DATE OF BIRTH:  12-21-1975  DATE OF ADMISSION:  05/23/2012 DATE OF DISCHARGE:  05/30/2012                              DISCHARGE SUMMARY   The patient was admitted at 2 weeks' gestation with severe IUGR of twin B and abnormal Dopplers with absent end-diastolic flow.  Decision was made to admit for in-house monitoring management daily Dopplers.  Her hospital course was unremarkable for stability in the fetal heart rate tracings with category 1 tracings on monitoring and stable ultrasound Dopplers and on hospital day #6.  There was no further absent end- diastolic flow noted and never any evidence of reversal after 3 days of elevated, but otherwise normal Dopplers with no end-diastolic flow. Decision was made by Maternal Fetal Medicine, Dr. Harlon Flor in conjunction with Dr. Sherrie George and Dr. Otho Perl to discharge the patient to home with 3 times weekly followup.  The patient concurs with plan, will follow up as noted.  DISCHARGE MEDICATIONS:  Prenatal vitamins.  She will follow up in the office within 1 week.  She will follow up with Maternal Medicine for twice weekly biophysical profiles and Dopplers on Monday, Wednesday, Friday weekly.     Lenoard Aden, M.D.     RJT/MEDQ  D:  05/30/2012  T:  05/31/2012  Job:  [id number]

## 2012-06-02 ENCOUNTER — Other Ambulatory Visit (HOSPITAL_COMMUNITY): Payer: Self-pay | Admitting: Obstetrics and Gynecology

## 2012-06-02 ENCOUNTER — Other Ambulatory Visit (HOSPITAL_COMMUNITY): Payer: 59

## 2012-06-02 ENCOUNTER — Ambulatory Visit (HOSPITAL_COMMUNITY)
Admit: 2012-06-02 | Discharge: 2012-06-02 | Disposition: A | Discharge status: Home / Self Care | Payer: 59 | Attending: Obstetrics | Admitting: Obstetrics

## 2012-06-02 VITALS — BP 122/81 | HR 120 | Wt 155.0 lb

## 2012-06-02 DIAGNOSIS — IMO0001 Reserved for inherently not codable concepts without codable children: Secondary | ICD-10-CM

## 2012-06-02 DIAGNOSIS — O30039 Twin pregnancy, monochorionic/diamniotic, unspecified trimester: Secondary | ICD-10-CM

## 2012-06-02 DIAGNOSIS — O365990 Maternal care for other known or suspected poor fetal growth, unspecified trimester, not applicable or unspecified: Secondary | ICD-10-CM | POA: Insufficient documentation

## 2012-06-02 DIAGNOSIS — IMO0002 Reserved for concepts with insufficient information to code with codable children: Secondary | ICD-10-CM

## 2012-06-02 DIAGNOSIS — O30009 Twin pregnancy, unspecified number of placenta and unspecified number of amniotic sacs, unspecified trimester: Secondary | ICD-10-CM | POA: Insufficient documentation

## 2012-06-02 DIAGNOSIS — O09529 Supervision of elderly multigravida, unspecified trimester: Secondary | ICD-10-CM | POA: Insufficient documentation

## 2012-06-02 NOTE — Progress Notes (Signed)
Patient seen for follow up BPP and Doppler studies.  See report in [facility].  Alpha Gula, MD  Monochorionic/diamniotic twin pregnancy at 27 weeks 3 days. Twin A: Normal amniotic fluid volume               Normal umbilical artery dopplers.               BPP 8/8 Twin B: Oligohydramnios               UA dopplers: elevated for GA, no absent or reversed end diastolic flow.               BPP 8/8  Continue 3x weekly umbilical artery Doppler studies and 2x weekly BPPs.

## 2012-06-02 NOTE — Addendum Note (Signed)
Encounter addended by: Candis Shine, MD on: 06/02/2012  1:21 PM<BR>     Documentation filed: Orders

## 2012-06-04 ENCOUNTER — Ambulatory Visit (HOSPITAL_COMMUNITY)
Admit: 2012-06-04 | Discharge: 2012-06-04 | Disposition: A | Discharge status: Home / Self Care | Payer: 59 | Attending: Obstetrics and Gynecology | Admitting: Obstetrics and Gynecology

## 2012-06-04 ENCOUNTER — Other Ambulatory Visit (HOSPITAL_COMMUNITY): Payer: Self-pay | Admitting: Maternal and Fetal Medicine

## 2012-06-04 DIAGNOSIS — O365990 Maternal care for other known or suspected poor fetal growth, unspecified trimester, not applicable or unspecified: Secondary | ICD-10-CM | POA: Insufficient documentation

## 2012-06-04 DIAGNOSIS — O30039 Twin pregnancy, monochorionic/diamniotic, unspecified trimester: Secondary | ICD-10-CM

## 2012-06-04 DIAGNOSIS — IMO0002 Reserved for concepts with insufficient information to code with codable children: Secondary | ICD-10-CM

## 2012-06-04 DIAGNOSIS — O09529 Supervision of elderly multigravida, unspecified trimester: Secondary | ICD-10-CM | POA: Insufficient documentation

## 2012-06-04 DIAGNOSIS — O30009 Twin pregnancy, unspecified number of placenta and unspecified number of amniotic sacs, unspecified trimester: Secondary | ICD-10-CM | POA: Insufficient documentation

## 2012-06-04 NOTE — Progress Notes (Signed)
Patient seen for follow up BPP and Doppler studies.  See report in [facility].  Alpha Gula, MD  Monochorionic/diamniotic twin pregnancy at 27 weeks 3 days. Twin A: Normal amniotic fluid volume               Normal umbilical artery dopplers.               BPP 8/8 Twin B: Amniotic fluid subjectively decreased (Max verticle pocket 2.8 cm)               UA dopplers: intermittently absent diastolic flow but no evidence of reversed flow.               BPP 8/8  Findings discussed with Dr. Billy Coast.  Plan follow up UA Dopplers tomorrow.  If intermittently absent or absent diastolic flow is again noted, plan admission for daily Dopplers and antepartum fetal monitoring.

## 2012-06-05 ENCOUNTER — Ambulatory Visit (HOSPITAL_COMMUNITY)
Admission: RE | Admit: 2012-06-05 | Discharge: 2012-06-05 | Disposition: A | Discharge status: Home / Self Care | Payer: 59 | Source: Ambulatory Visit | Attending: Obstetrics and Gynecology | Admitting: Obstetrics and Gynecology

## 2012-06-05 VITALS — BP 123/74 | HR 108 | Wt 157.2 lb

## 2012-06-05 DIAGNOSIS — O09529 Supervision of elderly multigravida, unspecified trimester: Secondary | ICD-10-CM | POA: Insufficient documentation

## 2012-06-05 DIAGNOSIS — IMO0002 Reserved for concepts with insufficient information to code with codable children: Secondary | ICD-10-CM

## 2012-06-05 DIAGNOSIS — O30009 Twin pregnancy, unspecified number of placenta and unspecified number of amniotic sacs, unspecified trimester: Secondary | ICD-10-CM | POA: Insufficient documentation

## 2012-06-05 DIAGNOSIS — O30039 Twin pregnancy, monochorionic/diamniotic, unspecified trimester: Secondary | ICD-10-CM

## 2012-06-05 DIAGNOSIS — O365990 Maternal care for other known or suspected poor fetal growth, unspecified trimester, not applicable or unspecified: Secondary | ICD-10-CM | POA: Insufficient documentation

## 2012-06-06 ENCOUNTER — Ambulatory Visit (HOSPITAL_COMMUNITY)
Admit: 2012-06-06 | Discharge: 2012-06-06 | Disposition: A | Discharge status: Home / Self Care | Payer: 59 | Attending: Obstetrics and Gynecology | Admitting: Obstetrics and Gynecology

## 2012-06-06 ENCOUNTER — Encounter (HOSPITAL_COMMUNITY): Payer: Self-pay

## 2012-06-06 VITALS — BP 132/77 | HR 112 | Wt 157.5 lb

## 2012-06-06 DIAGNOSIS — O30009 Twin pregnancy, unspecified number of placenta and unspecified number of amniotic sacs, unspecified trimester: Secondary | ICD-10-CM | POA: Insufficient documentation

## 2012-06-06 DIAGNOSIS — O09529 Supervision of elderly multigravida, unspecified trimester: Secondary | ICD-10-CM | POA: Insufficient documentation

## 2012-06-06 DIAGNOSIS — IMO0002 Reserved for concepts with insufficient information to code with codable children: Secondary | ICD-10-CM

## 2012-06-06 DIAGNOSIS — O30039 Twin pregnancy, monochorionic/diamniotic, unspecified trimester: Secondary | ICD-10-CM

## 2012-06-06 DIAGNOSIS — O365990 Maternal care for other known or suspected poor fetal growth, unspecified trimester, not applicable or unspecified: Secondary | ICD-10-CM | POA: Insufficient documentation

## 2012-06-09 ENCOUNTER — Other Ambulatory Visit (HOSPITAL_COMMUNITY): Payer: Self-pay | Admitting: Obstetrics and Gynecology

## 2012-06-09 ENCOUNTER — Ambulatory Visit (HOSPITAL_COMMUNITY)
Admission: RE | Admit: 2012-06-09 | Discharge: 2012-06-09 | Disposition: A | Discharge status: Home / Self Care | Payer: 59 | Source: Ambulatory Visit | Attending: Obstetrics and Gynecology | Admitting: Obstetrics and Gynecology

## 2012-06-09 ENCOUNTER — Encounter (HOSPITAL_COMMUNITY): Payer: Self-pay

## 2012-06-09 VITALS — BP 137/97 | HR 120 | Wt 158.0 lb

## 2012-06-09 DIAGNOSIS — O30009 Twin pregnancy, unspecified number of placenta and unspecified number of amniotic sacs, unspecified trimester: Secondary | ICD-10-CM | POA: Insufficient documentation

## 2012-06-09 DIAGNOSIS — O30039 Twin pregnancy, monochorionic/diamniotic, unspecified trimester: Secondary | ICD-10-CM

## 2012-06-09 DIAGNOSIS — O09529 Supervision of elderly multigravida, unspecified trimester: Secondary | ICD-10-CM | POA: Insufficient documentation

## 2012-06-09 DIAGNOSIS — IMO0002 Reserved for concepts with insufficient information to code with codable children: Secondary | ICD-10-CM

## 2012-06-09 DIAGNOSIS — O365990 Maternal care for other known or suspected poor fetal growth, unspecified trimester, not applicable or unspecified: Secondary | ICD-10-CM | POA: Insufficient documentation

## 2012-06-09 NOTE — Progress Notes (Signed)
Patient seen for follow up BPP and Doppler studies.  See report in [facility].  Maria Gula, MD  Monochorionic/diamniotic twin pregnancy at 28+3 weeks   UA dopplers were normal for twin A; UA dopplers were elevated for twin B with continuous diastolic flow  Twin A: BPP 8/8; twin B: BPP 8/8   Last growth 07/25   Continue UA dopplers three times per week; BPPs twice weekly  Follow up growth scan on 9 August.

## 2012-06-11 ENCOUNTER — Ambulatory Visit (HOSPITAL_COMMUNITY)
Admission: RE | Admit: 2012-06-11 | Discharge: 2012-06-11 | Disposition: A | Discharge status: Home / Self Care | Payer: 59 | Source: Ambulatory Visit | Attending: Obstetrics and Gynecology | Admitting: Obstetrics and Gynecology

## 2012-06-11 ENCOUNTER — Other Ambulatory Visit (HOSPITAL_COMMUNITY): Payer: Self-pay | Admitting: Obstetrics and Gynecology

## 2012-06-11 ENCOUNTER — Encounter (HOSPITAL_COMMUNITY): Payer: Self-pay

## 2012-06-11 VITALS — BP 118/80 | HR 120 | Wt 159.0 lb

## 2012-06-11 DIAGNOSIS — IMO0002 Reserved for concepts with insufficient information to code with codable children: Secondary | ICD-10-CM

## 2012-06-11 DIAGNOSIS — O30039 Twin pregnancy, monochorionic/diamniotic, unspecified trimester: Secondary | ICD-10-CM

## 2012-06-11 DIAGNOSIS — O09529 Supervision of elderly multigravida, unspecified trimester: Secondary | ICD-10-CM | POA: Insufficient documentation

## 2012-06-11 DIAGNOSIS — O365990 Maternal care for other known or suspected poor fetal growth, unspecified trimester, not applicable or unspecified: Secondary | ICD-10-CM | POA: Insufficient documentation

## 2012-06-11 DIAGNOSIS — O30009 Twin pregnancy, unspecified number of placenta and unspecified number of amniotic sacs, unspecified trimester: Secondary | ICD-10-CM

## 2012-06-11 NOTE — Progress Notes (Signed)
Patient seen for follow up Doppler studies.  See report in [facility].  Alpha Gula, MD  Monochorionic/diamniotic twin pregnancy at 28+5 weeks   UA dopplers were normal for twin A; UA dopplers were elevated for twin B with continuous diastolic flow   Last growth 07/25  Continue UA dopplers three times per week; BPPs twice weekly  Follow up growth scan on 9 August.

## 2012-06-13 ENCOUNTER — Other Ambulatory Visit (HOSPITAL_COMMUNITY): Payer: Self-pay | Admitting: Obstetrics and Gynecology

## 2012-06-13 ENCOUNTER — Ambulatory Visit (HOSPITAL_COMMUNITY)
Admission: RE | Admit: 2012-06-13 | Discharge: 2012-06-13 | Disposition: A | Discharge status: Home / Self Care | Payer: 59 | Source: Ambulatory Visit | Attending: Obstetrics and Gynecology | Admitting: Obstetrics and Gynecology

## 2012-06-13 VITALS — BP 125/69 | HR 93

## 2012-06-13 DIAGNOSIS — IMO0002 Reserved for concepts with insufficient information to code with codable children: Secondary | ICD-10-CM

## 2012-06-13 DIAGNOSIS — O30039 Twin pregnancy, monochorionic/diamniotic, unspecified trimester: Secondary | ICD-10-CM

## 2012-06-13 DIAGNOSIS — O09529 Supervision of elderly multigravida, unspecified trimester: Secondary | ICD-10-CM | POA: Insufficient documentation

## 2012-06-13 DIAGNOSIS — O30009 Twin pregnancy, unspecified number of placenta and unspecified number of amniotic sacs, unspecified trimester: Secondary | ICD-10-CM

## 2012-06-13 DIAGNOSIS — O365990 Maternal care for other known or suspected poor fetal growth, unspecified trimester, not applicable or unspecified: Secondary | ICD-10-CM | POA: Insufficient documentation

## 2012-06-13 NOTE — Progress Notes (Signed)
Patient seen today  for follow up ultrasound.  See full report in [facility].  Alpha Gula, MD  Monochorionic/diamniotic twin pregnancy at 29 weeks 0 days.  Twin A:  Interval growth appropriate (56th%tile)   Normal amniotic fluid volume               Normal umbilical artery dopplers.                BPP 8/8  Twin B: Estimated fetal weight 545g (<10th%tile) - 122 g weight gain over last 2 weeks   Amniotic fluid subjectively decreased (Max verticle pocket 3.3cm)                UA dopplers: Elevated, but diastolic flow noted throughout                BPP 6/8 (-2 for fetal breathing- present, but not sustained); NST reasuring  Continue UA dopplers three times per week; BPPs twice weekly  Follow up growth scan in 2 weeks.

## 2012-06-16 ENCOUNTER — Ambulatory Visit (HOSPITAL_COMMUNITY)
Admission: RE | Admit: 2012-06-16 | Discharge: 2012-06-16 | Disposition: A | Discharge status: Home / Self Care | Payer: 59 | Source: Ambulatory Visit | Attending: Obstetrics and Gynecology | Admitting: Obstetrics and Gynecology

## 2012-06-16 VITALS — BP 128/80 | HR 98 | Wt 162.0 lb

## 2012-06-16 DIAGNOSIS — O09529 Supervision of elderly multigravida, unspecified trimester: Secondary | ICD-10-CM | POA: Insufficient documentation

## 2012-06-16 DIAGNOSIS — O30009 Twin pregnancy, unspecified number of placenta and unspecified number of amniotic sacs, unspecified trimester: Secondary | ICD-10-CM

## 2012-06-16 DIAGNOSIS — IMO0002 Reserved for concepts with insufficient information to code with codable children: Secondary | ICD-10-CM

## 2012-06-16 DIAGNOSIS — O365990 Maternal care for other known or suspected poor fetal growth, unspecified trimester, not applicable or unspecified: Secondary | ICD-10-CM | POA: Insufficient documentation

## 2012-06-16 DIAGNOSIS — O30039 Twin pregnancy, monochorionic/diamniotic, unspecified trimester: Secondary | ICD-10-CM

## 2012-06-17 ENCOUNTER — Other Ambulatory Visit (HOSPITAL_COMMUNITY): Payer: Self-pay | Admitting: Obstetrics and Gynecology

## 2012-06-17 DIAGNOSIS — IMO0002 Reserved for concepts with insufficient information to code with codable children: Secondary | ICD-10-CM

## 2012-06-18 ENCOUNTER — Encounter (HOSPITAL_COMMUNITY): Payer: Self-pay

## 2012-06-18 ENCOUNTER — Inpatient Hospital Stay (HOSPITAL_COMMUNITY)
Admission: AD | Admit: 2012-06-18 | Discharge: 2012-06-19 | DRG: 782 | Disposition: A | Discharge status: Home / Self Care | Payer: 59 | Source: Ambulatory Visit | Attending: Obstetrics and Gynecology | Admitting: Obstetrics and Gynecology

## 2012-06-18 ENCOUNTER — Ambulatory Visit (HOSPITAL_COMMUNITY)
Admission: RE | Admit: 2012-06-18 | Discharge: 2012-06-18 | Disposition: A | Discharge status: Home / Self Care | Payer: 59 | Source: Ambulatory Visit | Attending: Obstetrics and Gynecology | Admitting: Obstetrics and Gynecology

## 2012-06-18 ENCOUNTER — Inpatient Hospital Stay (HOSPITAL_COMMUNITY): Payer: 59

## 2012-06-18 VITALS — BP 133/57 | HR 112 | Wt 165.0 lb

## 2012-06-18 DIAGNOSIS — O30009 Twin pregnancy, unspecified number of placenta and unspecified number of amniotic sacs, unspecified trimester: Secondary | ICD-10-CM | POA: Insufficient documentation

## 2012-06-18 DIAGNOSIS — O365990 Maternal care for other known or suspected poor fetal growth, unspecified trimester, not applicable or unspecified: Principal | ICD-10-CM | POA: Diagnosis present

## 2012-06-18 DIAGNOSIS — IMO0002 Reserved for concepts with insufficient information to code with codable children: Secondary | ICD-10-CM

## 2012-06-18 DIAGNOSIS — O09529 Supervision of elderly multigravida, unspecified trimester: Secondary | ICD-10-CM | POA: Insufficient documentation

## 2012-06-18 DIAGNOSIS — O30039 Twin pregnancy, monochorionic/diamniotic, unspecified trimester: Secondary | ICD-10-CM

## 2012-06-18 LAB — CBC
HCT: 34.6 % — ABNORMAL LOW (ref 36.0–46.0)
Platelets: 204 10*3/uL (ref 150–400)
RDW: 12.4 % (ref 11.5–15.5)
WBC: 8.1 10*3/uL (ref 4.0–10.5)

## 2012-06-18 LAB — TYPE AND SCREEN
ABO/RH(D): O NEG
DAT, IgG: NEGATIVE

## 2012-06-18 LAB — DIFFERENTIAL
Basophils Absolute: 0 10*3/uL (ref 0.0–0.1)
Lymphocytes Relative: 16 % (ref 12–46)
Neutro Abs: 6.2 10*3/uL (ref 1.7–7.7)
Neutrophils Relative %: 77 % (ref 43–77)

## 2012-06-18 MED ORDER — PRENATAL MULTIVITAMIN CH: 1.0000 | ORAL_TABLET | Freq: Every day | ORAL | Status: DC

## 2012-06-18 MED ORDER — ACETAMINOPHEN 325 MG PO TABS: 650.0000 mg | ORAL_TABLET | ORAL | Status: DC | PRN

## 2012-06-18 MED ORDER — ZOLPIDEM TARTRATE 5 MG PO TABS: 5.0000 mg | ORAL_TABLET | Freq: Every evening | ORAL | Status: DC | PRN

## 2012-06-18 MED ORDER — DOCUSATE SODIUM 100 MG PO CAPS
100.0000 mg | ORAL_CAPSULE | Freq: Every day | ORAL | Status: DC
  Administered 2012-06-18: 100 mg via ORAL
  Filled 2012-06-18: qty 1

## 2012-06-18 MED ORDER — SALINE SPRAY 0.65 % NA SOLN
1.0000 | NASAL | Status: DC | PRN
  Administered 2012-06-18: 1 via NASAL
  Filled 2012-06-18: qty 44

## 2012-06-18 MED ORDER — CALCIUM CARBONATE ANTACID 500 MG PO CHEW: 2.0000 | CHEWABLE_TABLET | ORAL | Status: DC | PRN

## 2012-06-18 NOTE — Progress Notes (Signed)
Patient ID: [name], female   DOB: 14-Jul-1976, 36 y.o.   MRN: [id number] Rpt MFM evaluation reassuring. Discussed with MFM. BPP 8/8 for A and B. UAD on B with no evidence of reversal of flow. Reassuring Category 1 tracing for A and B. Rpt UAD and bpp in am and DC pending results.

## 2012-06-18 NOTE — Progress Notes (Signed)
Difficulty tracing baby b due to fetal movement.  Audible throughout.

## 2012-06-18 NOTE — ED Notes (Signed)
Antenatal charge nurse made aware of admission.  Room unavailable at this time.  Will call when available.  Pt placed on NST monitor until admission.

## 2012-06-18 NOTE — H&P (Signed)
[name], [name]              ACCOUNT NO.:  [id number]  MEDICAL RECORD NO.:  [id number]  LOCATION:  9175                          FACILITY:  [facility]  PHYSICIAN:  Lenoard Aden, M.D.DATE OF BIRTH:  03/08/1976  DATE OF ADMISSION:  06/18/2012 DATE OF DISCHARGE:                             HISTORY & PHYSICAL   CHIEF COMPLAINT:  Twin pregnancy with intermittent absent end-diastolic flow and intermittent reversal of flow and severe IUGR of twin B.  HISTORY OF PRESENT ILLNESS:  She is a 36 year old white female, G3, P1 with a history of vacuum assisted vaginal delivery at term with known monochorionic diamniotic twins, now with recurrent intermittent absent end-diastolic flow and severe IUGR of twin B as recommended for admission and continuous monitoring by Maternal Fetal Medicine.  ALLERGIES:  She has no known drug allergies.  MEDICATIONS:  Prenatal vitamins.  SOCIAL HISTORY:  She is a nonsmoker, nondrinker.  She denies domestic or physical violence.  FAMILY HISTORY:  Remarkable for diabetes, heart disease, chronic hypertension, colon cancer.  PREVIOUS OBSTETRIC HISTORY:  Remarkable for a term vacuum-assisted vaginal delivery and a TAB in 1994.  Also.  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  Prenatal course is complicated as noted.  PHYSICAL EXAMINATION:  GENERAL:  She is a well-developed, well-nourished white female, in no acute distress. HEENT:  Normal. NECK:  Supple.  Full range of motion. LUNGS:  Clear. ABDOMEN:  Soft, gravid, nontender.  There is no CVA tenderness. EXTREMITIES:  There are no cords. NEUROLOGIC:  Nonfocal. SKIN:  Intact. PELVIC:  Deferred.  NST is pending.  Doppler flow studies performed today by Maternal Fetal Medicine as noted.  IMPRESSION:  A 29 and 5/7th week monochorionic diamniotic twin gestation with severe intrauterine growth restriction of twin B, intermittent absent end-diastolic flow  on Dopplers and intermittent reversal of flow.  PLAN:  Is to admit, continuous monitoring at this time, daily Doppler studies with persistent reversal flow.  We will proceed with magnesium for neuro prophylaxis. Continue rescue dose of betamethasone.  The patient will continue daily consultation and daily Dopplers at this point with Maternal Fetal Medicine.  NICU consult will be ordered.  The patient acknowledges the plan and understands the possible need for early delivery of twin A with the change in Doppler flow studies with twin B because of the potential risks to twin A.  Delivery was also discussed to be likely by cesarean section.  The patient acknowledges these risks.  Questions were answered and we will proceed.     Lenoard Aden, M.D.     RJT/MEDQ  D:  06/18/2012  T:  06/18/2012  Job:  [id number]

## 2012-06-19 ENCOUNTER — Inpatient Hospital Stay (HOSPITAL_COMMUNITY): Payer: 59

## 2012-06-19 NOTE — Progress Notes (Signed)
Verified with MD that pt did not need NST before discharge since she just had ultrasound

## 2012-06-19 NOTE — Progress Notes (Signed)
Ms. Noorani was seen for a second ultrasound appointment today.  Please see [facility] report for details.

## 2012-06-19 NOTE — Progress Notes (Signed)
[name] was seen for ultrasound appointment today.  Please see [facility] report for details.  

## 2012-06-19 NOTE — Progress Notes (Signed)
Ms. [name] was seen for ultrasound appointment today.  Please see [facility] report for details.  

## 2012-06-19 NOTE — Progress Notes (Signed)
Patient ID: SIMRAH CHATHAM, female   DOB: 1976-05-10, 36 y.o.   MRN: [id number] HD #1 29 6/7 weeks  Severe IUGR Twin B  S: No complaints Good FM. No contractions. No bleeding. No LOF  O: BP 128/60  Pulse 93  Temp 98 F (36.7 C) (Oral)  Resp 20  Ht 5\' 4"  (1.626 m)  Wt 74.39 kg (164 lb)  BMI 28.15 kg/m2  LMP 11/23/2011  Head: NCAT Neck : supple with FROM Lungs: CTA CV: RRR Abd: Gravid, NT Ext: no c/c/e Neuro: non focal Skin: intact  NST reactive x 2 No decels, Category 1 tracing. Rpt BPP today- 8/8 A and B. UAD Twin B with intermittent AEDF, no reversal  A; 29 6/7 weeks Mono chorionic Di Amniotic Twins with severe IUGR Twin B Dopplers stable today.  P: DC home Fu with MFM tomorrow.

## 2012-06-19 NOTE — Progress Notes (Signed)
UR Chart review completed.  

## 2012-06-20 ENCOUNTER — Ambulatory Visit (HOSPITAL_COMMUNITY)
Admission: RE | Admit: 2012-06-20 | Discharge: 2012-06-20 | Disposition: A | Discharge status: Home / Self Care | Payer: 59 | Source: Ambulatory Visit | Attending: Obstetrics and Gynecology | Admitting: Obstetrics and Gynecology

## 2012-06-20 VITALS — BP 122/81 | HR 120 | Wt 165.0 lb

## 2012-06-20 DIAGNOSIS — O09529 Supervision of elderly multigravida, unspecified trimester: Secondary | ICD-10-CM | POA: Insufficient documentation

## 2012-06-20 DIAGNOSIS — O365990 Maternal care for other known or suspected poor fetal growth, unspecified trimester, not applicable or unspecified: Secondary | ICD-10-CM | POA: Insufficient documentation

## 2012-06-20 DIAGNOSIS — O30009 Twin pregnancy, unspecified number of placenta and unspecified number of amniotic sacs, unspecified trimester: Secondary | ICD-10-CM | POA: Insufficient documentation

## 2012-06-20 DIAGNOSIS — IMO0002 Reserved for concepts with insufficient information to code with codable children: Secondary | ICD-10-CM

## 2012-06-20 DIAGNOSIS — O30039 Twin pregnancy, monochorionic/diamniotic, unspecified trimester: Secondary | ICD-10-CM

## 2012-06-23 ENCOUNTER — Ambulatory Visit (HOSPITAL_COMMUNITY)
Admit: 2012-06-23 | Discharge: 2012-06-23 | Disposition: A | Discharge status: Home / Self Care | Payer: 59 | Attending: Obstetrics and Gynecology | Admitting: Obstetrics and Gynecology

## 2012-06-23 ENCOUNTER — Ambulatory Visit (HOSPITAL_COMMUNITY)
Admission: RE | Admit: 2012-06-23 | Discharge: 2012-06-23 | Disposition: A | Discharge status: Home / Self Care | Payer: 59 | Source: Ambulatory Visit | Attending: Obstetrics and Gynecology | Admitting: Obstetrics and Gynecology

## 2012-06-23 ENCOUNTER — Other Ambulatory Visit (HOSPITAL_COMMUNITY): Payer: Self-pay | Admitting: Obstetrics and Gynecology

## 2012-06-23 VITALS — BP 124/83 | HR 110 | Wt 165.5 lb

## 2012-06-23 DIAGNOSIS — O09529 Supervision of elderly multigravida, unspecified trimester: Secondary | ICD-10-CM

## 2012-06-23 DIAGNOSIS — O30009 Twin pregnancy, unspecified number of placenta and unspecified number of amniotic sacs, unspecified trimester: Secondary | ICD-10-CM

## 2012-06-23 DIAGNOSIS — IMO0002 Reserved for concepts with insufficient information to code with codable children: Secondary | ICD-10-CM

## 2012-06-23 DIAGNOSIS — O365990 Maternal care for other known or suspected poor fetal growth, unspecified trimester, not applicable or unspecified: Secondary | ICD-10-CM

## 2012-06-23 DIAGNOSIS — O30039 Twin pregnancy, monochorionic/diamniotic, unspecified trimester: Secondary | ICD-10-CM

## 2012-06-23 NOTE — Progress Notes (Signed)
NST on baby B only per Dr. Claudean Severance.

## 2012-06-23 NOTE — Progress Notes (Signed)
[name]  was seen today for an ultrasound appointment.  See full report in [facility].  Alpha Gula, MD  Monochorionic/diamniotic twin pregnancy at 30+3 weeks  Twin A: Normal amniotic fluid volume  UA dopplers were normal for this GA  BPP 8/8  Twin B: Low normal amniotic fluid volume  UA dopplers were elevated for this GA (> 97.5% tile) but with diastolic flow throughout. BPP 8/10  (-2 for lack of breathing movement; reactive NST)  Continue UA dopplers three times per week; BPPs twice weekly  Follow up growth scan in later this week

## 2012-06-24 ENCOUNTER — Other Ambulatory Visit (HOSPITAL_COMMUNITY): Payer: Self-pay | Admitting: Obstetrics and Gynecology

## 2012-06-24 DIAGNOSIS — IMO0002 Reserved for concepts with insufficient information to code with codable children: Secondary | ICD-10-CM

## 2012-06-25 ENCOUNTER — Ambulatory Visit (HOSPITAL_COMMUNITY)
Admit: 2012-06-25 | Discharge: 2012-06-25 | Disposition: A | Discharge status: Home / Self Care | Payer: 59 | Attending: Obstetrics and Gynecology | Admitting: Obstetrics and Gynecology

## 2012-06-25 VITALS — BP 122/91 | HR 106 | Wt 167.0 lb

## 2012-06-25 DIAGNOSIS — O30009 Twin pregnancy, unspecified number of placenta and unspecified number of amniotic sacs, unspecified trimester: Secondary | ICD-10-CM | POA: Insufficient documentation

## 2012-06-25 DIAGNOSIS — O09529 Supervision of elderly multigravida, unspecified trimester: Secondary | ICD-10-CM | POA: Insufficient documentation

## 2012-06-25 DIAGNOSIS — O30039 Twin pregnancy, monochorionic/diamniotic, unspecified trimester: Secondary | ICD-10-CM

## 2012-06-25 DIAGNOSIS — O365990 Maternal care for other known or suspected poor fetal growth, unspecified trimester, not applicable or unspecified: Secondary | ICD-10-CM | POA: Insufficient documentation

## 2012-06-25 DIAGNOSIS — IMO0002 Reserved for concepts with insufficient information to code with codable children: Secondary | ICD-10-CM

## 2012-06-25 NOTE — Progress Notes (Signed)
[name]  was seen today for an ultrasound appointment.  See full report in [facility].  Alpha Gula, MD  Monochorionic/diamniotic twin pregnancy at 30+5 weeks  Twin A: Normal amniotic fluid volume  UA dopplers were normal for this GA  BPP 8/8  Twin B: Low normal amniotic fluid volume  UA dopplers were elevated for this GA (> 97.5% tile) but with diastolic flow throughout. BPP 8/8    Continue UA dopplers three times per week; BPPs twice weekly  Follow up growth scan in later this week

## 2012-06-27 ENCOUNTER — Other Ambulatory Visit (HOSPITAL_COMMUNITY): Payer: Self-pay | Admitting: Obstetrics and Gynecology

## 2012-06-27 ENCOUNTER — Ambulatory Visit (HOSPITAL_COMMUNITY)
Admit: 2012-06-27 | Discharge: 2012-06-27 | Disposition: A | Discharge status: Home / Self Care | Payer: 59 | Attending: Obstetrics and Gynecology | Admitting: Obstetrics and Gynecology

## 2012-06-27 DIAGNOSIS — O30009 Twin pregnancy, unspecified number of placenta and unspecified number of amniotic sacs, unspecified trimester: Secondary | ICD-10-CM | POA: Insufficient documentation

## 2012-06-27 DIAGNOSIS — O09529 Supervision of elderly multigravida, unspecified trimester: Secondary | ICD-10-CM

## 2012-06-27 DIAGNOSIS — O365990 Maternal care for other known or suspected poor fetal growth, unspecified trimester, not applicable or unspecified: Secondary | ICD-10-CM | POA: Insufficient documentation

## 2012-06-30 ENCOUNTER — Ambulatory Visit (HOSPITAL_COMMUNITY)
Admission: RE | Admit: 2012-06-30 | Discharge: 2012-06-30 | Disposition: A | Discharge status: Home / Self Care | Payer: 59 | Source: Ambulatory Visit | Attending: Obstetrics and Gynecology | Admitting: Obstetrics and Gynecology

## 2012-06-30 VITALS — BP 126/80 | HR 113 | Wt 169.0 lb

## 2012-06-30 DIAGNOSIS — IMO0002 Reserved for concepts with insufficient information to code with codable children: Secondary | ICD-10-CM

## 2012-06-30 DIAGNOSIS — O30039 Twin pregnancy, monochorionic/diamniotic, unspecified trimester: Secondary | ICD-10-CM

## 2012-06-30 DIAGNOSIS — O09529 Supervision of elderly multigravida, unspecified trimester: Secondary | ICD-10-CM | POA: Insufficient documentation

## 2012-06-30 DIAGNOSIS — O365990 Maternal care for other known or suspected poor fetal growth, unspecified trimester, not applicable or unspecified: Secondary | ICD-10-CM | POA: Insufficient documentation

## 2012-06-30 DIAGNOSIS — O30009 Twin pregnancy, unspecified number of placenta and unspecified number of amniotic sacs, unspecified trimester: Secondary | ICD-10-CM

## 2012-06-30 NOTE — Progress Notes (Signed)
[name]  was seen today for an ultrasound appointment.  See full report in [facility].  Alpha Gula, MD  Monochorionic/diamniotic twin pregnancy at 31+3 weeks  Normal amniotic fluid volume in Twin A; subjectively low normal fluid in Twin B  Twin A:  BPP 8/8. Normal umbilical artery Doppler studies Twin B:  BPP 8/10 (-2 for lack of sustained fetal breathing activity)   Elevated umbilical artery Dopplers noted with diastolic flow noted on 4/5 tracings; one one tracing noted to have intermittently absent diastolic flow.   Continue UA dopplers three times per week; BPPs twice weekly   If persisent absent end-diastolic flow noted, would recommend delivery; if otherwise stable, plan delivery at [redacted] weeks gestation.

## 2012-07-01 ENCOUNTER — Other Ambulatory Visit (HOSPITAL_COMMUNITY): Payer: Self-pay | Admitting: Obstetrics and Gynecology

## 2012-07-01 DIAGNOSIS — IMO0002 Reserved for concepts with insufficient information to code with codable children: Secondary | ICD-10-CM

## 2012-07-02 ENCOUNTER — Ambulatory Visit (HOSPITAL_COMMUNITY)
Admission: RE | Admit: 2012-07-02 | Discharge: 2012-07-02 | Disposition: A | Discharge status: Home / Self Care | Payer: 59 | Source: Ambulatory Visit | Attending: Obstetrics and Gynecology | Admitting: Obstetrics and Gynecology

## 2012-07-02 ENCOUNTER — Encounter (HOSPITAL_COMMUNITY): Payer: Self-pay

## 2012-07-02 ENCOUNTER — Other Ambulatory Visit (HOSPITAL_COMMUNITY): Payer: Self-pay | Admitting: Obstetrics and Gynecology

## 2012-07-02 VITALS — BP 124/86 | HR 117 | Wt 169.5 lb

## 2012-07-02 DIAGNOSIS — O30009 Twin pregnancy, unspecified number of placenta and unspecified number of amniotic sacs, unspecified trimester: Secondary | ICD-10-CM

## 2012-07-02 DIAGNOSIS — IMO0002 Reserved for concepts with insufficient information to code with codable children: Secondary | ICD-10-CM

## 2012-07-02 DIAGNOSIS — O365990 Maternal care for other known or suspected poor fetal growth, unspecified trimester, not applicable or unspecified: Secondary | ICD-10-CM | POA: Insufficient documentation

## 2012-07-02 DIAGNOSIS — O09529 Supervision of elderly multigravida, unspecified trimester: Secondary | ICD-10-CM | POA: Insufficient documentation

## 2012-07-02 DIAGNOSIS — O30039 Twin pregnancy, monochorionic/diamniotic, unspecified trimester: Secondary | ICD-10-CM

## 2012-07-02 NOTE — Progress Notes (Signed)
Ms. Tibbetts was seen for ultrasound appointment today.  Please see [facility] report for details.  

## 2012-07-03 ENCOUNTER — Other Ambulatory Visit (HOSPITAL_COMMUNITY): Payer: Self-pay | Admitting: Maternal and Fetal Medicine

## 2012-07-03 DIAGNOSIS — IMO0002 Reserved for concepts with insufficient information to code with codable children: Secondary | ICD-10-CM

## 2012-07-04 ENCOUNTER — Ambulatory Visit (HOSPITAL_COMMUNITY)
Admission: RE | Admit: 2012-07-04 | Discharge: 2012-07-04 | Disposition: A | Discharge status: Home / Self Care | Payer: 59 | Source: Ambulatory Visit | Attending: Obstetrics and Gynecology | Admitting: Obstetrics and Gynecology

## 2012-07-04 VITALS — BP 129/71 | HR 93

## 2012-07-04 DIAGNOSIS — O09529 Supervision of elderly multigravida, unspecified trimester: Secondary | ICD-10-CM | POA: Insufficient documentation

## 2012-07-04 DIAGNOSIS — IMO0002 Reserved for concepts with insufficient information to code with codable children: Secondary | ICD-10-CM

## 2012-07-04 DIAGNOSIS — O365990 Maternal care for other known or suspected poor fetal growth, unspecified trimester, not applicable or unspecified: Secondary | ICD-10-CM | POA: Insufficient documentation

## 2012-07-04 DIAGNOSIS — O30009 Twin pregnancy, unspecified number of placenta and unspecified number of amniotic sacs, unspecified trimester: Secondary | ICD-10-CM | POA: Insufficient documentation

## 2012-07-04 DIAGNOSIS — O30039 Twin pregnancy, monochorionic/diamniotic, unspecified trimester: Secondary | ICD-10-CM

## 2012-07-04 NOTE — Progress Notes (Signed)
[name]  was seen today for an ultrasound appointment.  See full report in [facility].  Alpha Gula, MD  Monochorionic diamnoitic intrauterine twin pregnancy at 32 weeks 0 days. Normal amniotic fluid volume x2. Normal interval fetal anatomy x2. BPP 8/8 x2. Normal umbilical artery dopplers of Twin A. Elevated umbilical artery S/D ratio without evidence of absent or reverse diastolic flow.  Continue UA dopplers three times per week; BPPs twice weekly  Follow up growth scan next week  If persisent absent end-diastolic flow noted, would recommend delivery; if otherwise stable, plan delivery at [redacted] weeks gestation.

## 2012-07-07 ENCOUNTER — Ambulatory Visit (HOSPITAL_COMMUNITY)
Admission: RE | Admit: 2012-07-07 | Discharge: 2012-07-07 | Disposition: A | Discharge status: Home / Self Care | Payer: 59 | Source: Ambulatory Visit | Attending: Maternal and Fetal Medicine | Admitting: Maternal and Fetal Medicine

## 2012-07-07 ENCOUNTER — Inpatient Hospital Stay (HOSPITAL_COMMUNITY)
Admission: RE | Admit: 2012-07-07 | Discharge: 2012-07-07 | Disposition: A | Discharge status: Home / Self Care | Payer: 59 | Attending: Obstetrics and Gynecology | Admitting: Obstetrics and Gynecology

## 2012-07-07 DIAGNOSIS — IMO0002 Reserved for concepts with insufficient information to code with codable children: Secondary | ICD-10-CM

## 2012-07-07 DIAGNOSIS — O30039 Twin pregnancy, monochorionic/diamniotic, unspecified trimester: Secondary | ICD-10-CM

## 2012-07-07 DIAGNOSIS — O30009 Twin pregnancy, unspecified number of placenta and unspecified number of amniotic sacs, unspecified trimester: Secondary | ICD-10-CM | POA: Insufficient documentation

## 2012-07-07 DIAGNOSIS — O09529 Supervision of elderly multigravida, unspecified trimester: Secondary | ICD-10-CM | POA: Insufficient documentation

## 2012-07-07 DIAGNOSIS — O368390 Maternal care for abnormalities of the fetal heart rate or rhythm, unspecified trimester, not applicable or unspecified: Secondary | ICD-10-CM | POA: Insufficient documentation

## 2012-07-07 DIAGNOSIS — O365990 Maternal care for other known or suspected poor fetal growth, unspecified trimester, not applicable or unspecified: Secondary | ICD-10-CM | POA: Insufficient documentation

## 2012-07-07 NOTE — Progress Notes (Signed)
Dr Seymour Bars notified on reactive NST, orders received

## 2012-07-07 NOTE — MAU Note (Signed)
Ultrasound this am, NST to follow up on baby B.

## 2012-07-08 ENCOUNTER — Other Ambulatory Visit (HOSPITAL_COMMUNITY): Payer: Self-pay | Admitting: Maternal and Fetal Medicine

## 2012-07-08 ENCOUNTER — Other Ambulatory Visit (HOSPITAL_COMMUNITY): Payer: Self-pay | Admitting: Obstetrics and Gynecology

## 2012-07-08 DIAGNOSIS — IMO0002 Reserved for concepts with insufficient information to code with codable children: Secondary | ICD-10-CM

## 2012-07-09 ENCOUNTER — Other Ambulatory Visit (HOSPITAL_COMMUNITY): Payer: Self-pay | Admitting: Obstetrics and Gynecology

## 2012-07-09 ENCOUNTER — Ambulatory Visit (HOSPITAL_COMMUNITY)
Admission: RE | Admit: 2012-07-09 | Discharge: 2012-07-09 | Disposition: A | Discharge status: Home / Self Care | Payer: 59 | Source: Ambulatory Visit | Attending: Obstetrics and Gynecology | Admitting: Obstetrics and Gynecology

## 2012-07-09 ENCOUNTER — Encounter (HOSPITAL_COMMUNITY): Payer: Self-pay | Admitting: Pharmacist

## 2012-07-09 VITALS — BP 142/85 | HR 126 | Wt 174.2 lb

## 2012-07-09 DIAGNOSIS — IMO0002 Reserved for concepts with insufficient information to code with codable children: Secondary | ICD-10-CM

## 2012-07-09 DIAGNOSIS — O30009 Twin pregnancy, unspecified number of placenta and unspecified number of amniotic sacs, unspecified trimester: Secondary | ICD-10-CM | POA: Insufficient documentation

## 2012-07-09 DIAGNOSIS — O30039 Twin pregnancy, monochorionic/diamniotic, unspecified trimester: Secondary | ICD-10-CM

## 2012-07-09 DIAGNOSIS — O365990 Maternal care for other known or suspected poor fetal growth, unspecified trimester, not applicable or unspecified: Secondary | ICD-10-CM | POA: Insufficient documentation

## 2012-07-09 DIAGNOSIS — O09529 Supervision of elderly multigravida, unspecified trimester: Secondary | ICD-10-CM | POA: Insufficient documentation

## 2012-07-11 ENCOUNTER — Ambulatory Visit (HOSPITAL_COMMUNITY)
Admission: RE | Admit: 2012-07-11 | Discharge: 2012-07-11 | Disposition: A | Discharge status: Home / Self Care | Payer: 59 | Source: Ambulatory Visit | Attending: Obstetrics and Gynecology | Admitting: Obstetrics and Gynecology

## 2012-07-11 VITALS — BP 132/87 | HR 115 | Wt 174.5 lb

## 2012-07-11 DIAGNOSIS — IMO0002 Reserved for concepts with insufficient information to code with codable children: Secondary | ICD-10-CM

## 2012-07-11 DIAGNOSIS — O365990 Maternal care for other known or suspected poor fetal growth, unspecified trimester, not applicable or unspecified: Secondary | ICD-10-CM | POA: Insufficient documentation

## 2012-07-11 DIAGNOSIS — O30039 Twin pregnancy, monochorionic/diamniotic, unspecified trimester: Secondary | ICD-10-CM

## 2012-07-11 DIAGNOSIS — O30009 Twin pregnancy, unspecified number of placenta and unspecified number of amniotic sacs, unspecified trimester: Secondary | ICD-10-CM | POA: Insufficient documentation

## 2012-07-11 DIAGNOSIS — O09529 Supervision of elderly multigravida, unspecified trimester: Secondary | ICD-10-CM | POA: Insufficient documentation

## 2012-07-14 ENCOUNTER — Ambulatory Visit (HOSPITAL_COMMUNITY)
Admission: RE | Admit: 2012-07-14 | Discharge: 2012-07-14 | Disposition: A | Discharge status: Home / Self Care | Payer: 59 | Source: Ambulatory Visit | Attending: Obstetrics and Gynecology | Admitting: Obstetrics and Gynecology

## 2012-07-14 ENCOUNTER — Other Ambulatory Visit: Payer: Self-pay | Admitting: Obstetrics and Gynecology

## 2012-07-14 ENCOUNTER — Encounter (HOSPITAL_COMMUNITY)
Admission: RE | Admit: 2012-07-14 | Discharge: 2012-07-14 | Disposition: A | Discharge status: Home / Self Care | Payer: 59 | Source: Ambulatory Visit | Attending: Obstetrics and Gynecology | Admitting: Obstetrics and Gynecology

## 2012-07-14 ENCOUNTER — Other Ambulatory Visit (HOSPITAL_COMMUNITY): Payer: Self-pay | Admitting: Maternal and Fetal Medicine

## 2012-07-14 ENCOUNTER — Encounter (HOSPITAL_COMMUNITY): Payer: Self-pay

## 2012-07-14 VITALS — BP 151/84 | Ht 64.0 in | Wt 176.0 lb

## 2012-07-14 VITALS — BP 137/86 | HR 118 | Wt 176.0 lb

## 2012-07-14 DIAGNOSIS — IMO0002 Reserved for concepts with insufficient information to code with codable children: Secondary | ICD-10-CM

## 2012-07-14 DIAGNOSIS — O09529 Supervision of elderly multigravida, unspecified trimester: Secondary | ICD-10-CM | POA: Insufficient documentation

## 2012-07-14 DIAGNOSIS — O30009 Twin pregnancy, unspecified number of placenta and unspecified number of amniotic sacs, unspecified trimester: Secondary | ICD-10-CM | POA: Insufficient documentation

## 2012-07-14 DIAGNOSIS — O365990 Maternal care for other known or suspected poor fetal growth, unspecified trimester, not applicable or unspecified: Secondary | ICD-10-CM | POA: Insufficient documentation

## 2012-07-14 DIAGNOSIS — O30039 Twin pregnancy, monochorionic/diamniotic, unspecified trimester: Secondary | ICD-10-CM

## 2012-07-14 LAB — CBC
MCHC: 32.9 g/dL (ref 30.0–36.0)
MCV: 87.6 fL (ref 78.0–100.0)
Platelets: 211 10*3/uL (ref 150–400)
RDW: 12.8 % (ref 11.5–15.5)
WBC: 7.5 10*3/uL (ref 4.0–10.5)

## 2012-07-14 LAB — TYPE AND SCREEN

## 2012-07-14 LAB — SURGICAL PCR SCREEN: MRSA, PCR: POSITIVE — AB

## 2012-07-14 NOTE — Progress Notes (Signed)
[name]  was seen today for an ultrasound appointment.  See full report in [facility].  Alpha Gula, MD  Monochorionic diamnoitic intrauterine twin pregnancy at 33 weeks 3 days. Normal amniotic fluid volume x2. BPP for Twin A 8/8 Initial BPP fo Twin B 6/8 (lack of sustained breathing activity), NST reassuring but not reactive.  BPP repeated 2 hours later - 8/8 with normal breathing activity. Normal umbilical artery dopplers of Twin A. Elevated umbilical artery S/D ratio without evidence of absent or reverse diastolic flow.  Follow up testing Thursday; delivery planned for Saturday 09/14

## 2012-07-14 NOTE — Pre-Procedure Instructions (Signed)
Dr Jorene Minors office notified of positive MRSA [facility]) due to twin gestation and possiblility of NICU admission for babies.

## 2012-07-14 NOTE — ED Notes (Signed)
Nonreactive NST on baby b.  Pt to come back after pre admission screening for repeat US.

## 2012-07-14 NOTE — Patient Instructions (Addendum)
20 JOSSELYN HARKINS  07/14/2012   Your procedure is scheduled on:  07/19/12  Enter through the Main Entrance of [facility] at 8 AM.  Pick up the phone at the desk and dial [phone].   Call this number if you have problems the morning of surgery: [phone]   Remember:   Do not eat food:After Midnight.  Do not drink clear liquids: After Midnight.  Take these medicines the morning of surgery with A SIP OF WATER: NA   Do not wear jewelry, make-up or nail polish.  Do not wear lotions, powders, or perfumes. You may wear deodorant.  Do not shave 48 hours prior to surgery.  Do not bring valuables to the hospital.  Contacts, dentures or bridgework may not be worn into surgery.  Leave suitcase in the car. After surgery it may be brought to your room.  For patients admitted to the hospital, checkout time is 11:00 AM the day of discharge.   Patients discharged the day of surgery will not be allowed to drive home.  Name and phone number of your driver: NA  Special Instructions: CHG Shower Use Special Wash: 1/2 bottle night before surgery and 1/2 bottle morning of surgery.   Please read over the following fact sheets that you were given: MRSA Information

## 2012-07-16 ENCOUNTER — Other Ambulatory Visit (HOSPITAL_COMMUNITY): Payer: Self-pay | Admitting: Obstetrics and Gynecology

## 2012-07-16 ENCOUNTER — Encounter (HOSPITAL_COMMUNITY): Payer: Self-pay

## 2012-07-16 ENCOUNTER — Ambulatory Visit (HOSPITAL_COMMUNITY)
Admission: RE | Admit: 2012-07-16 | Discharge: 2012-07-16 | Disposition: A | Discharge status: Home / Self Care | Payer: 59 | Source: Ambulatory Visit | Attending: Obstetrics and Gynecology | Admitting: Obstetrics and Gynecology

## 2012-07-16 VITALS — BP 136/84 | HR 117 | Wt 175.2 lb

## 2012-07-16 DIAGNOSIS — O09529 Supervision of elderly multigravida, unspecified trimester: Secondary | ICD-10-CM | POA: Insufficient documentation

## 2012-07-16 DIAGNOSIS — IMO0001 Reserved for inherently not codable concepts without codable children: Secondary | ICD-10-CM

## 2012-07-16 DIAGNOSIS — O30009 Twin pregnancy, unspecified number of placenta and unspecified number of amniotic sacs, unspecified trimester: Secondary | ICD-10-CM | POA: Insufficient documentation

## 2012-07-16 DIAGNOSIS — Z01812 Encounter for preprocedural laboratory examination: Secondary | ICD-10-CM | POA: Insufficient documentation

## 2012-07-16 DIAGNOSIS — IMO0002 Reserved for concepts with insufficient information to code with codable children: Secondary | ICD-10-CM

## 2012-07-16 DIAGNOSIS — O30039 Twin pregnancy, monochorionic/diamniotic, unspecified trimester: Secondary | ICD-10-CM

## 2012-07-16 DIAGNOSIS — O365990 Maternal care for other known or suspected poor fetal growth, unspecified trimester, not applicable or unspecified: Secondary | ICD-10-CM | POA: Insufficient documentation

## 2012-07-16 NOTE — Progress Notes (Signed)
[name] was seen for ultrasound appointment today.  Please see [facility] report for details.  

## 2012-07-19 ENCOUNTER — Encounter (HOSPITAL_COMMUNITY): Payer: Self-pay | Admitting: *Deleted

## 2012-07-19 ENCOUNTER — Encounter (HOSPITAL_COMMUNITY): Payer: Self-pay | Admitting: Anesthesiology

## 2012-07-19 ENCOUNTER — Encounter (HOSPITAL_COMMUNITY)
Admission: RE | Disposition: A | Discharge status: Home / Self Care | Payer: Self-pay | Source: Ambulatory Visit | Attending: Obstetrics and Gynecology

## 2012-07-19 ENCOUNTER — Inpatient Hospital Stay (HOSPITAL_COMMUNITY): Payer: 59 | Admitting: Anesthesiology

## 2012-07-19 ENCOUNTER — Inpatient Hospital Stay (HOSPITAL_COMMUNITY)
Admission: RE | Admit: 2012-07-19 | Discharge: 2012-07-22 | DRG: 765 | Disposition: A | Discharge status: Home / Self Care | Payer: 59 | Source: Ambulatory Visit | Attending: Obstetrics and Gynecology | Admitting: Obstetrics and Gynecology

## 2012-07-19 DIAGNOSIS — O30009 Twin pregnancy, unspecified number of placenta and unspecified number of amniotic sacs, unspecified trimester: Principal | ICD-10-CM | POA: Diagnosis present

## 2012-07-19 DIAGNOSIS — IMO0002 Reserved for concepts with insufficient information to code with codable children: Secondary | ICD-10-CM | POA: Diagnosis present

## 2012-07-19 DIAGNOSIS — O9903 Anemia complicating the puerperium: Secondary | ICD-10-CM | POA: Diagnosis not present

## 2012-07-19 DIAGNOSIS — O09529 Supervision of elderly multigravida, unspecified trimester: Secondary | ICD-10-CM | POA: Diagnosis present

## 2012-07-19 DIAGNOSIS — D62 Acute posthemorrhagic anemia: Secondary | ICD-10-CM | POA: Diagnosis not present

## 2012-07-19 DIAGNOSIS — O9902 Anemia complicating childbirth: Secondary | ICD-10-CM | POA: Diagnosis present

## 2012-07-19 DIAGNOSIS — O30039 Twin pregnancy, monochorionic/diamniotic, unspecified trimester: Secondary | ICD-10-CM

## 2012-07-19 DIAGNOSIS — Z98891 History of uterine scar from previous surgery: Secondary | ICD-10-CM

## 2012-07-19 DIAGNOSIS — O365990 Maternal care for other known or suspected poor fetal growth, unspecified trimester, not applicable or unspecified: Secondary | ICD-10-CM | POA: Diagnosis present

## 2012-07-19 HISTORY — DX: Anemia complicating childbirth: O99.02

## 2012-07-19 SURGERY — Surgical Case
Anesthesia: Spinal | Wound class: Clean Contaminated

## 2012-07-19 MED ORDER — SIMETHICONE 80 MG PO CHEW: 80.0000 mg | CHEWABLE_TABLET | ORAL | Status: DC | PRN

## 2012-07-19 MED ORDER — DIPHENHYDRAMINE HCL 50 MG/ML IJ SOLN: 12.5000 mg | INTRAMUSCULAR | Status: DC | PRN

## 2012-07-19 MED ORDER — DIPHENHYDRAMINE HCL 25 MG PO CAPS: 25.0000 mg | ORAL_CAPSULE | Freq: Four times a day (QID) | ORAL | Status: DC | PRN

## 2012-07-19 MED ORDER — ONDANSETRON HCL 4 MG/2ML IJ SOLN: 4.0000 mg | Freq: Three times a day (TID) | INTRAMUSCULAR | Status: DC | PRN

## 2012-07-19 MED ORDER — OXYCODONE-ACETAMINOPHEN 5-325 MG PO TABS
1.0000 | ORAL_TABLET | ORAL | Status: DC | PRN
  Administered 2012-07-20 (×2): 2 via ORAL
  Administered 2012-07-20: 1 via ORAL
  Administered 2012-07-21 (×2): 2 via ORAL
  Administered 2012-07-21 (×2): 1 via ORAL
  Filled 2012-07-19: qty 2
  Filled 2012-07-19: qty 1
  Filled 2012-07-19: qty 2
  Filled 2012-07-19: qty 1
  Filled 2012-07-19 (×2): qty 2
  Filled 2012-07-19: qty 1

## 2012-07-19 MED ORDER — ACETAMINOPHEN 10 MG/ML IV SOLN
1000.0000 mg | Freq: Four times a day (QID) | INTRAVENOUS | Status: AC | PRN
  Filled 2012-07-19: qty 100

## 2012-07-19 MED ORDER — MEPERIDINE HCL 25 MG/ML IJ SOLN: 6.2500 mg | INTRAMUSCULAR | Status: DC | PRN

## 2012-07-19 MED ORDER — OXYTOCIN 10 UNIT/ML IJ SOLN: 40.0000 [IU] | INTRAVENOUS | Status: DC | PRN

## 2012-07-19 MED ORDER — SIMETHICONE 80 MG PO CHEW
80.0000 mg | CHEWABLE_TABLET | Freq: Three times a day (TID) | ORAL | Status: DC
  Administered 2012-07-19 – 2012-07-22 (×11): 80 mg via ORAL

## 2012-07-19 MED ORDER — IBUPROFEN 600 MG PO TABS
600.0000 mg | ORAL_TABLET | Freq: Four times a day (QID) | ORAL | Status: DC
  Administered 2012-07-19 – 2012-07-22 (×12): 600 mg via ORAL
  Filled 2012-07-19 (×12): qty 1

## 2012-07-19 MED ORDER — ONDANSETRON HCL 4 MG/2ML IJ SOLN: 4.0000 mg | INTRAMUSCULAR | Status: DC | PRN

## 2012-07-19 MED ORDER — NALBUPHINE HCL 10 MG/ML IJ SOLN
5.0000 mg | INTRAMUSCULAR | Status: DC | PRN
  Filled 2012-07-19: qty 1

## 2012-07-19 MED ORDER — NALOXONE HCL 0.4 MG/ML IJ SOLN: 0.4000 mg | INTRAMUSCULAR | Status: DC | PRN

## 2012-07-19 MED ORDER — PROMETHAZINE HCL 25 MG/ML IJ SOLN: 6.2500 mg | INTRAMUSCULAR | Status: DC | PRN

## 2012-07-19 MED ORDER — ZOLPIDEM TARTRATE 5 MG PO TABS: 5.0000 mg | ORAL_TABLET | Freq: Every evening | ORAL | Status: DC | PRN

## 2012-07-19 MED ORDER — KETOROLAC TROMETHAMINE 30 MG/ML IJ SOLN
30.0000 mg | Freq: Four times a day (QID) | INTRAMUSCULAR | Status: AC | PRN
  Administered 2012-07-19: 30 mg via INTRAVENOUS

## 2012-07-19 MED ORDER — FENTANYL CITRATE 0.05 MG/ML IJ SOLN: 25.0000 ug | INTRAMUSCULAR | Status: DC | PRN

## 2012-07-19 MED ORDER — WITCH HAZEL-GLYCERIN EX PADS: 1.0000 "application " | MEDICATED_PAD | CUTANEOUS | Status: DC | PRN

## 2012-07-19 MED ORDER — MORPHINE SULFATE (PF) 0.5 MG/ML IJ SOLN
INTRAMUSCULAR | Status: DC | PRN
  Administered 2012-07-19: .1 mg via INTRATHECAL

## 2012-07-19 MED ORDER — LACTATED RINGERS IV SOLN
INTRAVENOUS | Status: DC
  Administered 2012-07-19 (×3): via INTRAVENOUS

## 2012-07-19 MED ORDER — LACTATED RINGERS IV SOLN
INTRAVENOUS | Status: DC
  Administered 2012-07-20: 06:00:00 via INTRAVENOUS

## 2012-07-19 MED ORDER — FENTANYL CITRATE 0.05 MG/ML IJ SOLN
INTRAMUSCULAR | Status: DC | PRN
  Administered 2012-07-19: 25 ug via INTRATHECAL

## 2012-07-19 MED ORDER — OXYTOCIN 10 UNIT/ML IJ SOLN
INTRAMUSCULAR | Status: AC
  Filled 2012-07-19: qty 4

## 2012-07-19 MED ORDER — MORPHINE SULFATE 0.5 MG/ML IJ SOLN
INTRAMUSCULAR | Status: AC
  Filled 2012-07-19: qty 10

## 2012-07-19 MED ORDER — METHYLERGONOVINE MALEATE 0.2 MG PO TABS: 0.2000 mg | ORAL_TABLET | ORAL | Status: DC | PRN

## 2012-07-19 MED ORDER — ONDANSETRON HCL 4 MG/2ML IJ SOLN
INTRAMUSCULAR | Status: DC | PRN
  Administered 2012-07-19: 4 mg via INTRAVENOUS

## 2012-07-19 MED ORDER — SODIUM CHLORIDE 0.9 % IJ SOLN: 3.0000 mL | INTRAMUSCULAR | Status: DC | PRN

## 2012-07-19 MED ORDER — KETOROLAC TROMETHAMINE 30 MG/ML IJ SOLN: 30.0000 mg | Freq: Four times a day (QID) | INTRAMUSCULAR | Status: AC | PRN

## 2012-07-19 MED ORDER — EPHEDRINE SULFATE 50 MG/ML IJ SOLN
INTRAMUSCULAR | Status: DC | PRN
  Administered 2012-07-19 (×3): 10 mg via INTRAVENOUS

## 2012-07-19 MED ORDER — OXYTOCIN 10 UNIT/ML IJ SOLN
40.0000 [IU] | INTRAVENOUS | Status: DC | PRN
  Administered 2012-07-19: 40 [IU] via INTRAVENOUS

## 2012-07-19 MED ORDER — SODIUM CHLORIDE 0.9 % IV SOLN
1.0000 ug/kg/h | INTRAVENOUS | Status: DC | PRN
  Filled 2012-07-19: qty 2.5

## 2012-07-19 MED ORDER — METHYLERGONOVINE MALEATE 0.2 MG/ML IJ SOLN: 0.2000 mg | INTRAMUSCULAR | Status: DC | PRN

## 2012-07-19 MED ORDER — CEFAZOLIN SODIUM-DEXTROSE 2-3 GM-% IV SOLR
2.0000 g | INTRAVENOUS | Status: AC
  Administered 2012-07-19: 2 g via INTRAVENOUS

## 2012-07-19 MED ORDER — DIBUCAINE 1 % RE OINT: 1.0000 "application " | TOPICAL_OINTMENT | RECTAL | Status: DC | PRN

## 2012-07-19 MED ORDER — LANOLIN HYDROUS EX OINT: 1.0000 "application " | TOPICAL_OINTMENT | CUTANEOUS | Status: DC | PRN

## 2012-07-19 MED ORDER — TETANUS-DIPHTH-ACELL PERTUSSIS 5-2.5-18.5 LF-MCG/0.5 IM SUSP: 0.5000 mL | Freq: Once | INTRAMUSCULAR | Status: DC

## 2012-07-19 MED ORDER — SCOPOLAMINE 1 MG/3DAYS TD PT72
1.0000 | MEDICATED_PATCH | Freq: Once | TRANSDERMAL | Status: DC
  Administered 2012-07-19: 1.5 mg via TRANSDERMAL

## 2012-07-19 MED ORDER — PHENYLEPHRINE HCL 10 MG/ML IJ SOLN
INTRAMUSCULAR | Status: DC | PRN
  Administered 2012-07-19 (×6): 40 ug via INTRAVENOUS

## 2012-07-19 MED ORDER — SCOPOLAMINE 1 MG/3DAYS TD PT72
MEDICATED_PATCH | TRANSDERMAL | Status: AC
  Administered 2012-07-19: 1.5 mg via TRANSDERMAL
  Filled 2012-07-19: qty 1

## 2012-07-19 MED ORDER — SENNOSIDES-DOCUSATE SODIUM 8.6-50 MG PO TABS
2.0000 | ORAL_TABLET | Freq: Every day | ORAL | Status: DC
  Administered 2012-07-19 – 2012-07-21 (×3): 2 via ORAL

## 2012-07-19 MED ORDER — PHENYLEPHRINE 40 MCG/ML (10ML) SYRINGE FOR IV PUSH (FOR BLOOD PRESSURE SUPPORT)
PREFILLED_SYRINGE | INTRAVENOUS | Status: AC
  Filled 2012-07-19: qty 5

## 2012-07-19 MED ORDER — DIPHENHYDRAMINE HCL 50 MG/ML IJ SOLN: 25.0000 mg | INTRAMUSCULAR | Status: DC | PRN

## 2012-07-19 MED ORDER — BUPIVACAINE IN DEXTROSE 0.75-8.25 % IT SOLN
INTRATHECAL | Status: DC | PRN
  Administered 2012-07-19: 12 mg via INTRATHECAL

## 2012-07-19 MED ORDER — PRENATAL MULTIVITAMIN CH
1.0000 | ORAL_TABLET | Freq: Every day | ORAL | Status: DC
  Administered 2012-07-20 – 2012-07-22 (×3): 1 via ORAL
  Filled 2012-07-19 (×3): qty 1

## 2012-07-19 MED ORDER — CEFAZOLIN SODIUM-DEXTROSE 2-3 GM-% IV SOLR
INTRAVENOUS | Status: AC
  Filled 2012-07-19: qty 50

## 2012-07-19 MED ORDER — SCOPOLAMINE 1 MG/3DAYS TD PT72
1.0000 | MEDICATED_PATCH | Freq: Once | TRANSDERMAL | Status: DC
  Filled 2012-07-19: qty 1

## 2012-07-19 MED ORDER — OXYTOCIN 40 UNITS IN LACTATED RINGERS INFUSION - SIMPLE MED
62.5000 mL/h | INTRAVENOUS | Status: AC
  Administered 2012-07-19: 62.5 mL/h via INTRAVENOUS
  Filled 2012-07-19: qty 1000

## 2012-07-19 MED ORDER — MENTHOL 3 MG MT LOZG: 1.0000 | LOZENGE | OROMUCOSAL | Status: DC | PRN

## 2012-07-19 MED ORDER — BUPIVACAINE HCL (PF) 0.25 % IJ SOLN
INTRAMUSCULAR | Status: DC | PRN
  Administered 2012-07-19: 10 mL

## 2012-07-19 MED ORDER — KETOROLAC TROMETHAMINE 30 MG/ML IJ SOLN
INTRAMUSCULAR | Status: AC
  Administered 2012-07-19: 30 mg via INTRAVENOUS
  Filled 2012-07-19: qty 1

## 2012-07-19 MED ORDER — ONDANSETRON HCL 4 MG/2ML IJ SOLN
INTRAMUSCULAR | Status: AC
  Filled 2012-07-19: qty 2

## 2012-07-19 MED ORDER — FENTANYL CITRATE 0.05 MG/ML IJ SOLN
INTRAMUSCULAR | Status: AC
  Filled 2012-07-19: qty 2

## 2012-07-19 MED ORDER — BUPIVACAINE HCL (PF) 0.25 % IJ SOLN
INTRAMUSCULAR | Status: AC
  Filled 2012-07-19: qty 30

## 2012-07-19 MED ORDER — DIPHENHYDRAMINE HCL 25 MG PO CAPS: 25.0000 mg | ORAL_CAPSULE | ORAL | Status: DC | PRN

## 2012-07-19 MED ORDER — METOCLOPRAMIDE HCL 5 MG/ML IJ SOLN: 10.0000 mg | Freq: Three times a day (TID) | INTRAMUSCULAR | Status: DC | PRN

## 2012-07-19 MED ORDER — ONDANSETRON HCL 4 MG PO TABS: 4.0000 mg | ORAL_TABLET | ORAL | Status: DC | PRN

## 2012-07-19 MED ORDER — MIDAZOLAM HCL 2 MG/2ML IJ SOLN: 0.5000 mg | Freq: Once | INTRAMUSCULAR | Status: DC | PRN

## 2012-07-19 MED ORDER — EPHEDRINE 5 MG/ML INJ
INTRAVENOUS | Status: AC
  Filled 2012-07-19: qty 10

## 2012-07-19 MED ORDER — INFLUENZA VIRUS VACC SPLIT PF IM SUSP
0.5000 mL | INTRAMUSCULAR | Status: AC
  Administered 2012-07-20: 0.5 mL via INTRAMUSCULAR
  Filled 2012-07-19: qty 0.5

## 2012-07-19 SURGICAL SUPPLY — 30 items
CLOTH BEACON ORANGE TIMEOUT ST (SAFETY) ×2 IMPLANT
CONTAINER PREFILL 10% NBF 15ML (MISCELLANEOUS) IMPLANT
DRESSING TELFA 8X3 (GAUZE/BANDAGES/DRESSINGS) IMPLANT
DRSG COVADERM 4X10 (GAUZE/BANDAGES/DRESSINGS) IMPLANT
ELECT REM PT RETURN 9FT ADLT (ELECTROSURGICAL) ×2
ELECTRODE REM PT RTRN 9FT ADLT (ELECTROSURGICAL) ×1 IMPLANT
EXTRACTOR VACUUM M CUP 4 TUBE (SUCTIONS) IMPLANT
GAUZE SPONGE 4X4 12PLY STRL LF (GAUZE/BANDAGES/DRESSINGS) ×4 IMPLANT
GLOVE BIO SURGEON STRL SZ7.5 (GLOVE) ×4 IMPLANT
GOWN PREVENTION PLUS LG XLONG (DISPOSABLE) ×4 IMPLANT
GOWN PREVENTION PLUS XLARGE (GOWN DISPOSABLE) ×2 IMPLANT
KIT ABG SYR 3ML LUER SLIP (SYRINGE) IMPLANT
NEEDLE HYPO 25X1 1.5 SAFETY (NEEDLE) ×2 IMPLANT
NEEDLE HYPO 25X5/8 SAFETYGLIDE (NEEDLE) IMPLANT
NS IRRIG 1000ML POUR BTL (IV SOLUTION) ×2 IMPLANT
PACK C SECTION WH (CUSTOM PROCEDURE TRAY) ×2 IMPLANT
PAD ABD 7.5X8 STRL (GAUZE/BANDAGES/DRESSINGS) IMPLANT
PAD OB MATERNITY 4.3X12.25 (PERSONAL CARE ITEMS) IMPLANT
SLEEVE SCD COMPRESS KNEE MED (MISCELLANEOUS) IMPLANT
STAPLER VISISTAT 35W (STAPLE) ×2 IMPLANT
SUT MNCRL 0 VIOLET CTX 36 (SUTURE) ×2 IMPLANT
SUT MON AB 2-0 CT1 27 (SUTURE) ×2 IMPLANT
SUT MON AB-0 CT1 36 (SUTURE) ×4 IMPLANT
SUT MONOCRYL 0 CTX 36 (SUTURE) ×2
SUT PLAIN 0 NONE (SUTURE) IMPLANT
SUT PLAIN 2 0 XLH (SUTURE) IMPLANT
SYR CONTROL 10ML LL (SYRINGE) ×2 IMPLANT
TOWEL OR 17X24 6PK STRL BLUE (TOWEL DISPOSABLE) ×4 IMPLANT
TRAY FOLEY CATH 14FR (SET/KITS/TRAYS/PACK) ×2 IMPLANT
WATER STERILE IRR 1000ML POUR (IV SOLUTION) ×2 IMPLANT

## 2012-07-19 NOTE — Progress Notes (Signed)
Patient ID: [name], female   DOB: 01-28-1976, 36 y.o.   MRN: [id number] Patient seen and examined. Consent witnessed and signed. No changes noted. Update completed.

## 2012-07-19 NOTE — Anesthesia Preprocedure Evaluation (Signed)
Anesthesia Evaluation  Patient identified by MRN, date of birth, ID band Patient awake    Reviewed: Allergy & Precautions, H&P , Patient's Chart, lab work & pertinent test results  Airway Mallampati: II TM Distance: >3 FB Neck ROM: full    Dental No notable dental hx.    Pulmonary neg pulmonary ROS,  breath sounds clear to auscultation  Pulmonary exam normal       Cardiovascular negative cardio ROS  Rhythm:regular Rate:Normal     Neuro/Psych negative neurological ROS  negative psych ROS   GI/Hepatic negative GI ROS, Neg liver ROS,   Endo/Other  negative endocrine ROS  Renal/GU negative Renal ROS     Musculoskeletal   Abdominal   Peds  Hematology negative hematology ROS (+)   Anesthesia Other Findings   Reproductive/Obstetrics (+) Pregnancy                           Anesthesia Physical Anesthesia Plan  ASA: II  Anesthesia Plan: Spinal   Post-op Pain Management:    Induction:   Airway Management Planned:   Additional Equipment:   Intra-op Plan:   Post-operative Plan:   Informed Consent: I have reviewed the patients History and Physical, chart, labs and discussed the procedure including the risks, benefits and alternatives for the proposed anesthesia with the patient or authorized representative who has indicated his/her understanding and acceptance.     Plan Discussed with:   Anesthesia Plan Comments:         Anesthesia Quick Evaluation  

## 2012-07-19 NOTE — Anesthesia Postprocedure Evaluation (Signed)
Anesthesia Post Note  Patient: [name]  Procedure(s) Performed: Procedure(s) (LRB): CESAREAN SECTION (N/A)  Anesthesia type: Spinal  Patient location: PACU  Post pain: Pain level controlled  Post assessment: Post-op Vital signs reviewed  Last Vitals:  Filed Vitals:   07/19/12 1115  BP: 127/74  Pulse: 93  Temp:   Resp: 16    Post vital signs: Reviewed  Level of consciousness: awake  Complications: No apparent anesthesia complications

## 2012-07-19 NOTE — Transfer of Care (Signed)
Immediate Anesthesia Transfer of Care Note  Patient: [name]  Procedure(s) Performed: Procedure(s) (LRB) with comments: CESAREAN SECTION (N/A) - Primary C/S.  EDD: 08/29/12/Twins  Patient Location: PACU  Anesthesia Type: Spinal  Level of Consciousness: awake, alert , oriented and patient cooperative  Airway & Oxygen Therapy: Patient Spontanous Breathing  Post-op Assessment: Report given to PACU RN, Post -op Vital signs reviewed and stable and Patient moving all extremities X 4  Post vital signs: Reviewed and stable  Complications: No apparent anesthesia complications

## 2012-07-19 NOTE — Addendum Note (Signed)
Addendum  created 07/19/12 2006 by Len Blalock, CRNA   Modules edited:Notes Section

## 2012-07-19 NOTE — Anesthesia Postprocedure Evaluation (Signed)
  Anesthesia Post-op Note  Patient: [name]  Procedure(s) Performed: Procedure(s) (LRB) with comments: CESAREAN SECTION (N/A) - Primary C/S.  EDD: 08/29/12/Twins  Patient Location: PACU and Women's Unit  Anesthesia Type: Spinal  Level of Consciousness: awake, alert  and oriented  Airway and Oxygen Therapy: Patient Spontanous Breathing    Post-op Assessment: Patient's Cardiovascular Status Stable and Respiratory Function Stable  Post-op Vital Signs: stable  Complications: No apparent anesthesia complications

## 2012-07-19 NOTE — Op Note (Signed)
Cesarean Section Procedure Note  Indications: Twin gestation Severe IUGR twin B with abnormal dopplers  Pre-operative Diagnosis: 34 week 1 day pregnancy.  Post-operative Diagnosis: same  Surgeon: Lenoard Aden   Assistants: Fredric Mare, CNM  Anesthesia: Local anesthesia 0.25.% bupivacaine and Spinal anesthesia  ASA Class: 1  Procedure Details  The patient was seen in the Holding Room. The risks, benefits, complications, treatment options, and expected outcomes were discussed with the patient.  The patient concurred with the proposed plan, giving informed consent. The risks of anesthesia, infection, bleeding and possible injury to other organs discussed. Injury to bowel, bladder, or ureter with possible need for repair discussed. Possible need for transfusion with secondary risks of hepatitis or HIV acquisition discussed. Post operative complications to include but not limited to DVT, PE and Pneumonia noted. The site of surgery properly noted/marked. The patient was taken to Operating Room # 1, identified as [name] and the procedure verified as C-Section Delivery. A Time Out was held and the above information confirmed.  After induction of anesthesia, the patient was draped and prepped in the usual sterile manner. A Pfannenstiel incision was made and carried down through the subcutaneous tissue to the fascia. Fascial incision was made and extended transversely using Mayo scissors. The fascia was separated from the underlying rectus tissue superiorly and inferiorly. The peritoneum was identified and entered. Peritoneal incision was extended longitudinally. The utero-vesical peritoneal reflection was incised transversely and the bladder flap was bluntly freed from the lower uterine segment. A low transverse uterine incision(Kerr hysterotomy) was made. Delivered from vtx presentation was a  Female twin A with Apgar scores of 9 at one minute and 9 at five minutes  Bulb suctioning gently  performed. Amniotomy and delivery from vtx position twin B female with Apgars pending.  Neonatal team in attendance.After the umbilical cord was clamped and cut cord blood was obtained for evaluation. The placenta was removed intact and appeared normal. The uterus was curetted with a dry lap pack. Good hemostasis was noted.The uterine outline, tubes and ovaries appeared normal. The uterine incision was closed with running locked sutures of 0 Monocryl x 1 layers. Hemostasis was observed. Lavage was carried out until clear. Both tubes isolated. Ampullary isthmic portion of both tubes interrupted in modified pomeroy fashion with segments removed and sent to pathology.The parietal peritoneum was closed with a running 2-0 Monocryl suture. The fascia was then reapproximated with running sutures of 0 Monocryl. The skin was reapproximated with staples.  Instrument, sponge, and needle counts were correct prior the abdominal closure and at the conclusion of the case.   Findings: Above  Estimated Blood Loss:  500         Drains: foley                 Specimens: placenta and tubal segments                 Complications:  None; patient tolerated the procedure well.         Disposition: PACU - hemodynamically stable.         Condition: stable  Attending Attestation: I performed the procedure.

## 2012-07-19 NOTE — Anesthesia Procedure Notes (Signed)
Spinal  Patient location during procedure: OR Start time: 07/19/2012 9:47 AM Staffing Anesthesiologist: Brayton Caves R Performed by: anesthesiologist  Preanesthetic Checklist Completed: patient identified, site marked, surgical consent, pre-op evaluation, timeout performed, IV checked, risks and benefits discussed and monitors and equipment checked Spinal Block Patient position: sitting Prep: DuraPrep Patient monitoring: heart rate, cardiac monitor, continuous pulse ox and blood pressure Approach: midline Location: L3-4 Injection technique: single-shot Needle Needle type: Sprotte  Needle gauge: 24 G Needle length: 9 cm Assessment Sensory level: T4 Additional Notes Patient identified.  Risk benefits discussed including failed block, incomplete pain control, headache, nerve damage, paralysis, blood pressure changes, nausea, vomiting, reactions to medication both toxic or allergic, and postpartum back pain.  Patient expressed understanding and wished to proceed.  All questions were answered.  Sterile technique used throughout procedure.  CSF was clear.  No parasthesia or other complications.  Please see nursing notes for vital signs.

## 2012-07-20 LAB — CBC
HCT: 26.2 % — ABNORMAL LOW (ref 36.0–46.0)
MCHC: 33.2 g/dL (ref 30.0–36.0)
Platelets: 150 10*3/uL (ref 150–400)
RDW: 13.1 % (ref 11.5–15.5)

## 2012-07-20 MED ORDER — RHO D IMMUNE GLOBULIN 1500 UNIT/2ML IJ SOLN
300.0000 ug | Freq: Once | INTRAMUSCULAR | Status: AC
  Administered 2012-07-20: 300 ug via INTRAMUSCULAR
  Filled 2012-07-20: qty 2

## 2012-07-20 MED ORDER — TETANUS-DIPHTH-ACELL PERTUSSIS 5-2.5-18.5 LF-MCG/0.5 IM SUSP
0.5000 mL | Freq: Once | INTRAMUSCULAR | Status: AC
  Administered 2012-07-22: 0.5 mL via INTRAMUSCULAR
  Filled 2012-07-20: qty 0.5

## 2012-07-20 NOTE — Progress Notes (Signed)
POSTOPERATIVE DAY # 1 S/P cesarean section / Mo-Di twins at 18 weeks with twin B IUGR   S:         Reports feeling mostly just tired / soreness and burning sensation in incision             Tolerating po intake / no nausea / no vomiting / no flatus / no  BM             Bleeding is light             Pain controlled with long-acting narcotic / started percocet this am             Up ad lib / ambulatory  Newborns in NICU   O:  Contact precautions for (+) MRSA screen on admit             A & O x 3              VS: Blood pressure 107/59, pulse 70, temperature 98 F (36.7 C), temperature source Oral, resp. rate 18, height 5\' 4"  (1.626 m), weight 79.379 kg (175 lb), last menstrual period 11/23/2011, SpO2 98.00%, unknown if currently breastfeeding.  LABS: WBC/Hgb/Hct/Plts:  7.2/8.7/26.2/150 (09/15 0543) (preop hgb 11.4)  I&O:   +2396  Lungs: Clear and unlabored  Heart: regular rate and rhythm / no mumurs  Abdomen: soft, non-tender, non-distended             Fundus: firm, non-tender, U-2             Dressing intact  Perineum: no edema  Lochia: light  Extremities: trace edema, no calf pain or tenderness  A:        POD # 1 S/P cesarean section at 34 week / twins Mo-Di / IUGR twin B            Acute blood loss anemia - operative blood loss  P:        Routine postoperative care              Start iron and colace tomorrow              [name] CNM, MSN 07/20/2012, 11:53 AM

## 2012-07-21 ENCOUNTER — Encounter (HOSPITAL_COMMUNITY): Payer: Self-pay | Admitting: *Deleted

## 2012-07-21 LAB — RH IG WORKUP (INCLUDES ABO/RH)
ABO/RH(D): O NEG
Unit division: 0

## 2012-07-21 NOTE — Progress Notes (Signed)
Ur chart review completed.  

## 2012-07-21 NOTE — Progress Notes (Signed)
This was a follow-up visit with Monquie whom I met when she was a patient on antenatal.  She was in relatively good spirits and is trying to stay positive.  She has good family support and that is helping her at this time.  She was grateful to hear about the care page website to give updates on the babies so that she does not have to continue to answer questions from everyone.  She is emotional to see her baby on a vent; but she is remaining positive.  We will continue to check in with family in the NICU, but please page [place] as needs arise.  [phone]  Kathleen Argue 1:43 PM   07/21/12 1300  Clinical Encounter Type  Visited With Family;Patient and family together  Visit Type Spiritual support  Referral From Nurse  Spiritual Encounters  Spiritual Needs Emotional

## 2012-07-21 NOTE — Plan of Care (Signed)
Problem: Discharge Progression Outcomes Goal: Activity appropriate for discharge plan Outcome: Completed/Met Date Met:  07/21/12 Ambulates well in room and hallways Goal: Pain controlled with appropriate interventions Outcome: Completed/Met Date Met:  07/21/12 Good pain relief with po Percocet

## 2012-07-21 NOTE — Progress Notes (Signed)
POSTOPERATIVE DAY # 2 S/P cesarean section - twins in NICU   S:         Patient in NICU earlier today with rounding - revisit this pm              Reports feeling well - better today             Tolerating po intake / no nausea / no  vomiting / + flatus / no  BM             Bleeding is spotting             Pain controlled with motrin and percocet             Up ad lib / ambulatory / voiding QS   O:  A & O x 3              VS: Blood pressure 126/83, pulse 88, temperature 98 F (36.7 C), temperature source Oral, resp. rate 18, height 5\' 4"  (1.626 m), weight 79.379 kg (175 lb), last menstrual period 11/23/2011, SpO2 98.00%, unknown if currently breastfeeding.  Lungs: Clear and unlabored  Heart: regular rate   Abdomen: soft, non-tender, non-distended             Fundus: firm, non-tender, U-3             Dressing OFF              Incision:  approximated with staples /  No erythema / small ecchymosis / no drainage  Perineum: no edema  Lochia: spotting  Extremities: no edema, no calf pain or tenderness  A:        POD # 2 S/P cesarean section            Mild ABL anemia  P:        Routine postoperative care              Anticipate d/c home tomorrow    [name] CNM, MSN 07/21/2012, 5:58 PM

## 2012-07-22 ENCOUNTER — Encounter (HOSPITAL_COMMUNITY): Payer: Self-pay

## 2012-07-22 DIAGNOSIS — O9902 Anemia complicating childbirth: Secondary | ICD-10-CM

## 2012-07-22 HISTORY — DX: Anemia complicating childbirth: O99.02

## 2012-07-22 MED ORDER — POLYSACCHARIDE IRON COMPLEX 150 MG PO CAPS: 150.0000 mg | ORAL_CAPSULE | Freq: Every day | ORAL | Status: DC

## 2012-07-22 MED ORDER — IBUPROFEN 600 MG PO TABS: 600.0000 mg | ORAL_TABLET | Freq: Four times a day (QID) | ORAL | Status: DC

## 2012-07-22 MED ORDER — OXYCODONE-ACETAMINOPHEN 5-325 MG PO TABS: 1.0000 | ORAL_TABLET | ORAL | Status: DC | PRN

## 2012-07-22 MED ORDER — DOCUSATE SODIUM 100 MG PO CAPS: 100.0000 mg | ORAL_CAPSULE | Freq: Every day | ORAL | Status: DC

## 2012-07-22 NOTE — Discharge Summary (Signed)
POSTOPERATIVE DISCHARGE SUMMARY:  Patient ID: [name] MRN: [id number] DOB/AGE: 1976-02-22 36 y.o.  Admit date: 07/19/2012 Discharge date:  07/22/2012   Admission Diagnoses: 1. 34 1/7 weeks monochorionic diamniotic twin gestation  with severe intrauterine growth restriction of twin B, intermittent  absent end-diastolic flow on Dopplers and intermittent reversal of flow 2. Undesired fertility  Discharge Diagnoses:  Status post primary cesarean section and bilateral tubal ligation at 34 weeks Post operative day 3 / stable  Prenatal history: [id number]   EDC : 08/29/2012, by Last Menstrual Period  Prenatal care at [facility] since [redacted] weeks gestation  Prenatal course complicated by twin gestation with discordant growth, severe growth restriction of Twin B. Rh negative status.  Prenatal Labs: ABO, Rh: O (03/18 0000)  Antibody: POS (09/14 0820) Rubella: 110.6 (07/19 1125)  RPR: NON REACTIVE (09/09 1143)  HBsAg: NEGATIVE (07/19 1125)  HIV: Non-reactive (03/18 1200)  GBS:   unknown 3 hr Glucola : normal   Medical / Surgical History :  Past medical history:  Past Medical History  Diagnosis Date  . Anemia   . Abnormal Pap smear   . Maternal anemia, with delivery 07/22/2012    Past surgical history:  Past Surgical History  Procedure Date  . Hip surgery 1985  . Colposcopy 2005  . Cryoablation 2005  . Wisdom tooth extraction 1996  . Cesarean section 07/19/2012    Procedure: CESAREAN SECTION;  Surgeon: Lenoard Aden, MD;  Location: [facility];  Service: Obstetrics;  Laterality: N/A;  Primary C/S.  EDD: 08/29/12/Twins    Family History:  Family History  Problem Relation Age of Onset  . Miscarriages / [place] Mother   . Diabetes Father   . Hypertension Father   . Cancer Maternal Grandmother     Social History:  reports that she has never smoked. She has never used smokeless tobacco. She reports that she does not drink alcohol or use illicit  drugs.   Allergies: Review of patient's allergies indicates no known allergies.    Current Medications at time of admission:  Prescriptions prior to admission  Medication Sig Dispense Refill  . Prenatal Vit-Fe Fumarate-FA (PRENATAL MULTIVITAMIN) TABS Take 1 tablet by mouth daily.         Admit labs:  07/14/2012 11:43  WBC 7.5  RBC 3.96  Hemoglobin 11.4 (L)  HCT 34.7 (L)  MCV 87.6  MCH 28.8  MCHC 32.9  RDW 12.8  Platelets 211     Intrapartum Course: n/a  Procedures: Cesarean section delivery of twin female newborns by Dr Billy Coast  See operative report for further details  Postoperative / postpartum course: uneventful. Patient received Rhogam for Rh incompatibility with twin newborns, TDaP booster and flu vaccine prior to discharge. Asymptomatic acute blood loss anemia noted, patient was started on oral iron supplement to continue post discharge x 4-6 weeks.   Physical Exam:  VSS: Blood pressure 121/68, pulse 88, temperature 98.3 F (36.8 C), temperature source Oral, resp. rate 18, height 5\' 4"  (1.626 m), weight 79.379 kg (175 lb), last menstrual period 11/23/2011, SpO2 99.00%, unknown if currently breastfeeding.   LABS:  Basename 07/20/12 0543  WBC 7.2  HGB 8.7*  HCT 26.2*  PLT 150       Incision:  approximated with staples / no erythema / no ecchymosis / no drainage Staples:  removed prior to discharge and replaced with benzoin and steri strips.   Discharge Instructions:  Discharged Condition: good Activity: pelvic rest, weight lifting and driving  restrictions x 2 weeks Diet: routine Medications:    Medication List     As of 07/22/2012 11:30 AM    TAKE these medications         docusate sodium 100 MG capsule   Commonly known as: COLACE   Take 1 capsule (100 mg total) by mouth daily.      ibuprofen 600 MG tablet   Commonly known as: ADVIL,MOTRIN   Take 1 tablet (600 mg total) by mouth every 6 (six) hours.      iron polysaccharides 150 MG capsule    Commonly known as: NIFEREX   Take 1 capsule (150 mg total) by mouth daily.      oxyCODONE-acetaminophen 5-325 MG per tablet   Commonly known as: PERCOCET/ROXICET   Take 1-2 tablets by mouth every 4 (four) hours as needed (moderate - severe pain).      prenatal multivitamin Tabs   Take 1 tablet by mouth daily.       Condition: stable Postpartum Instructions: refer to practice specific booklet Discharge to: home Disposition: 01-Home or Self Care Follow up :      Follow-up Information    Follow up with Lenoard Aden, MD. Schedule an appointment as soon as possible for a visit in 6 weeks.   Contact information:   [address] [place] [address] [phone]           Signed: Arlan Organ 07/22/2012, 11:30 AM

## 2012-07-22 NOTE — Progress Notes (Addendum)
Infectious  Disease  States  All MRSA  pts are to maintain   Isolation until d/c  NO repeat  Test can be  Done prior to30) days   Number to  Call for  Information [phone]

## 2012-07-22 NOTE — Progress Notes (Signed)
Subjective: POD# 3 Information for the patient's newborn:  [name], [name] [[id number]]  female Information for the patient's newborn:  [name], [name] [[id number]]  female  NICU admit / prematurity  Reports feeling well, ready for DC Feeding: breast / pumping Patient reports tolerating PO.  Breast symptoms: no discomfort Pain controlled with Motrin and Percocet. Denies HA/SOB/C/P/N/V/dizziness. Flatus present. She reports vaginal bleeding as normal, without clots.  She is ambulating, urinating without difficult.     Objective:   VS:  Filed Vitals:   07/21/12 1154 07/21/12 1759 07/21/12 2154 07/22/12 0531  BP: 126/83 134/75 127/81 121/68  Pulse: 88 96 95 88  Temp: 98 F (36.7 C) 98.1 F (36.7 C) 97.5 F (36.4 C) 98.3 F (36.8 C)  TempSrc: Oral Oral Oral Oral  Resp: 18 18 18 18   Height:      Weight:      SpO2: 98% 98% 99% 99%    No intake or output data in the 24 hours ending 07/22/12 1122       Basename 07/20/12 0543  WBC 7.2  HGB 8.7*  HCT 26.2*  PLT 150     Blood type: --/--/O NEG (09/15 0543)  Rubella: 110.6 (07/19 1125)     Physical Exam:  General: alert, cooperative and no distress CV: Regular rate and rhythm Resp: clear Abdomen: soft, nontender, normal bowel sounds Incision: clean, dry, intact and staples in place Uterine Fundus: firm, below umbilicus, nontender Lochia: minimal Ext: extremities normal, atraumatic, no cyanosis or edema and no edema, redness or tenderness in the calves or thighs      Assessment/Plan: 36 y.o.   POD# 3.  s/p Cesarean Delivery.  Indications: 34 weeks mo/di twins, Twin B severe IUGR                Active Problems:  Postpartum care following cesarean delivery (9/14)  Mo/Di- twin B with intrauterine growth restriction   S/P cesarean section  Maternal anemia, with delivery S/P ABX for MRSA positive status  Doing well, stable.    Anemia asymptomatic, will start oral Fe supplement at DC Rpt MRSA screen for  TOC Routine post-op care TDaP vaccine prior to DC, flu immunization up to date DC home w/ WOB instructions  Launi Asencio 07/22/2012, 11:22 AM

## 2012-07-22 NOTE — H&P (Signed)
NAME:  [name], HOCHSTETLER                   ACCOUNT NO.:  MEDICAL RECORD NO.:  [id number]  LOCATION:                                 FACILITY:  PHYSICIAN:  Lenoard Aden, M.D.DATE OF BIRTH:  Aug 12, 1976  DATE OF ADMISSION: DATE OF DISCHARGE:                             HISTORY & PHYSICAL   CHIEF COMPLAINT:  __________ with monochorionic diamniotic class with severe IUGR __________ of twin B for delivery.  HISTORY OF PRESENT ILLNESS:  She is a 36 year old female, G2, P1, with a 34-1/[redacted] weeks gestation __________.  She has no known drug allergies to medications.  __________ vaginal delivery.  SURGICAL HISTORY:  Remarkable for hip surgery in 1985 __________.  __________ chronic hypertension, diabetes, or colon cancer.  PHYSICAL EXAMINATION:  GENERAL:  She is a well-developed, well-nourished white female in no acute distress. VITAL SIGNS:  Blood pressure 140/80 __________. HEENT:  Normal. NECK:  Supple. HEART:  Regular rhythm. ABDOMEN:  Soft, gravid, nontender.  Cervix is fingertip, 50%, vertex, - 2. EXTREMITIES:  No cords. NEUROLOGIC:  Nonfocal. SKIN:  Intact.  IMPRESSION: 1. A 34-1/7 week intrauterine pregnancy. 2. Twin monochorionic diamniotic gestation of twin B.  PLAN:  Proceed with primary low segment transverse cesarean section and tubal ligation.  Risks of anesthesia, infection, bleeding, injury to abdominal organs, need for repair discussed, delayed versus immediate complications.  The __________ noted.  The patient acknowledges and wishes to proceed.     Lenoard Aden, M.D.     RJT/MEDQ  D:  07/18/2012  T:  07/18/2012  Job:  [id number]

## 2012-07-23 LAB — TYPE AND SCREEN
ABO/RH(D): O NEG
Unit division: 0
Unit division: 0

## 2012-08-14 NOTE — Addendum Note (Signed)
Encounter addended by: Lenoard Aden, MD on: 08/14/2012  7:18 AM<BR>     Documentation filed: Charting, Inpatient Notes

## 2012-08-14 NOTE — Progress Notes (Signed)
Patient ID: [name], female   DOB: January 02, 1976, 36 y.o.   MRN: [id number] Pt admitted for 23 hr observation and repeat antenatal testing Repeat testing noted to be reassuring. Patient was discharged to home in good condition. Discharge medications were PNV. Scheduled for twice weekly followup as previously specified. Discharge teaching was done.

## 2013-06-17 IMAGING — US USMFM FETAL BPP W/O NON-STRESS ADDL GEST
1 series · 12 of 20 positions shown · non-contrast
Comparison: none

[Series 1: usmfm fetal bpp w/o non-stress addl gest · 0.26mm/px · 20 acquisitions, 12 frames shown]
[im 1/20]
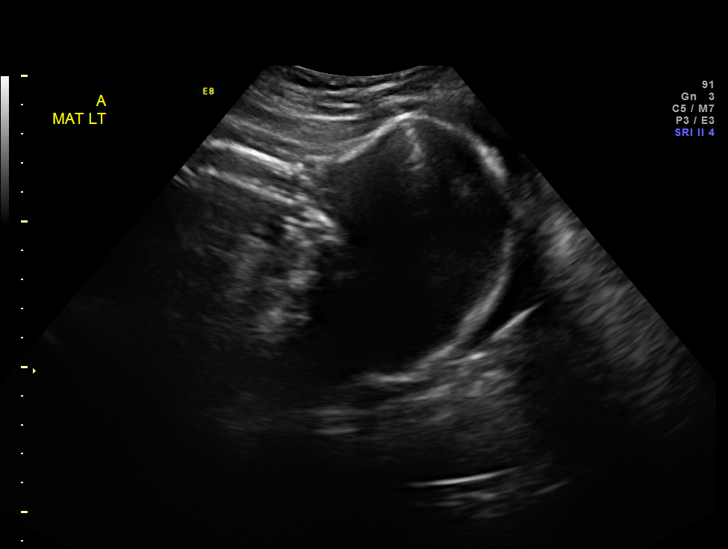
[im 3/20]
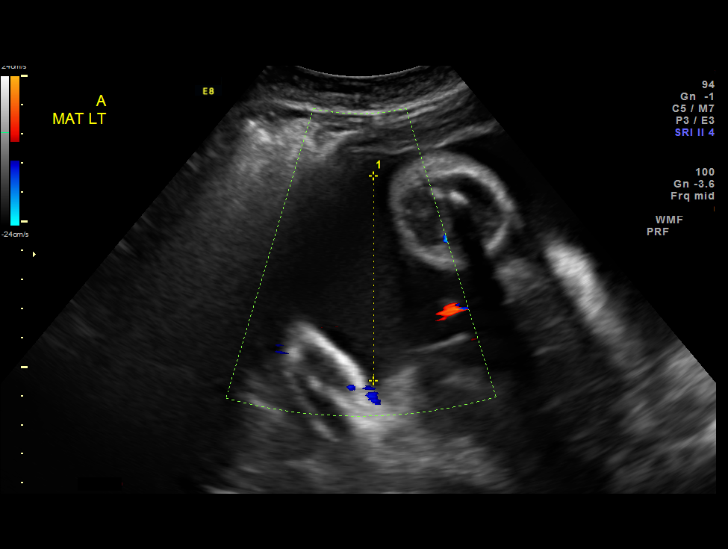
[im 5/20]
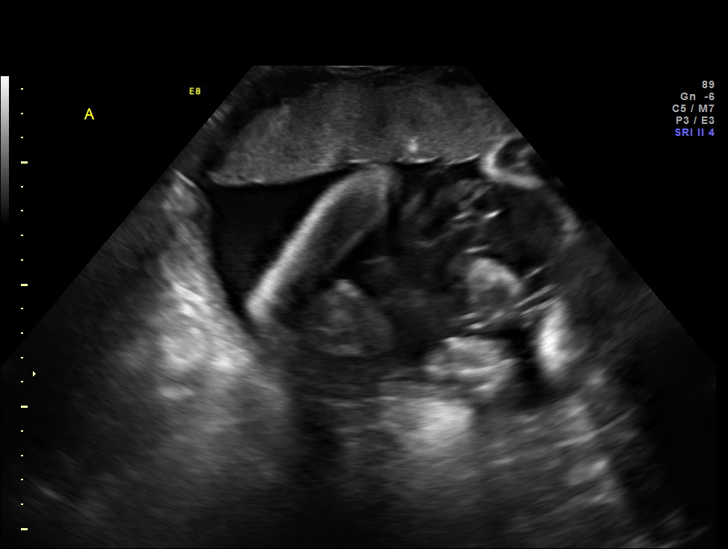
[im 6/20]
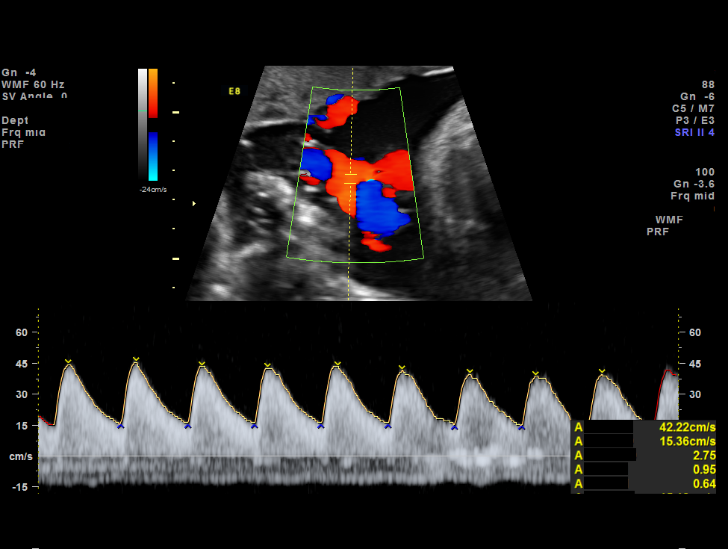
[im 8/20]
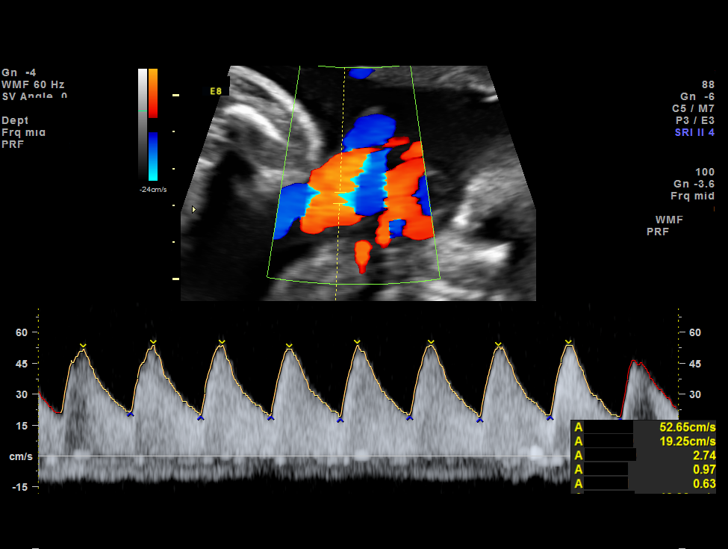
[im 10/20]
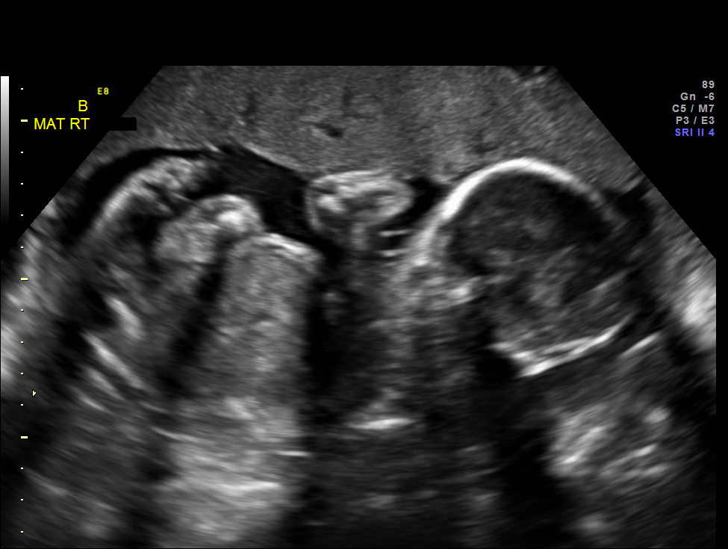
[im 11/20]
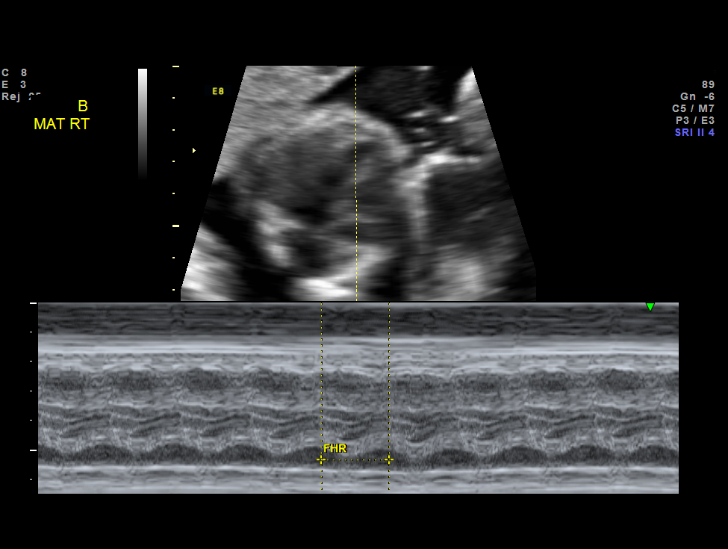
[im 13/20]
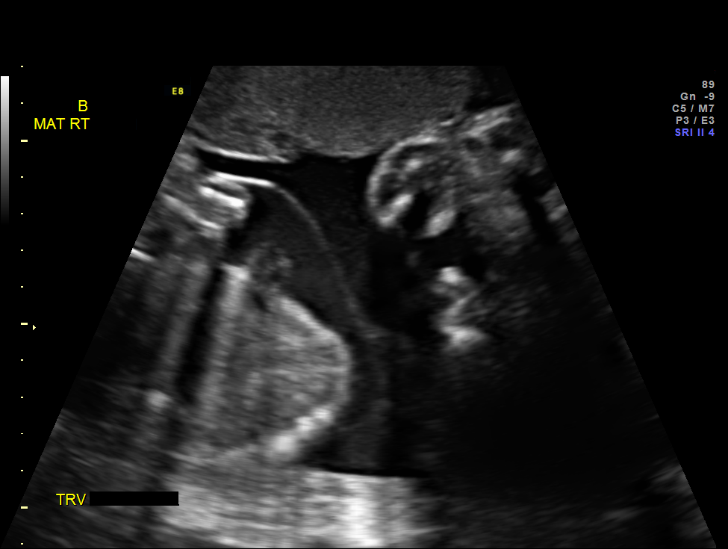
[im 15/20]
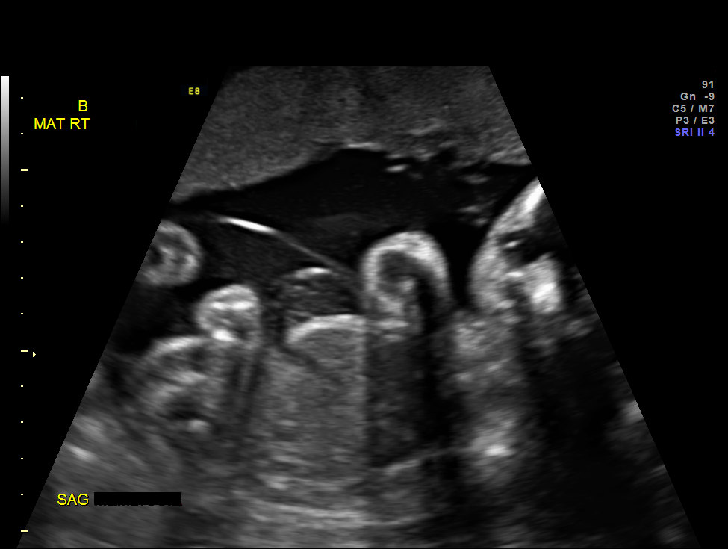
[im 16/20]
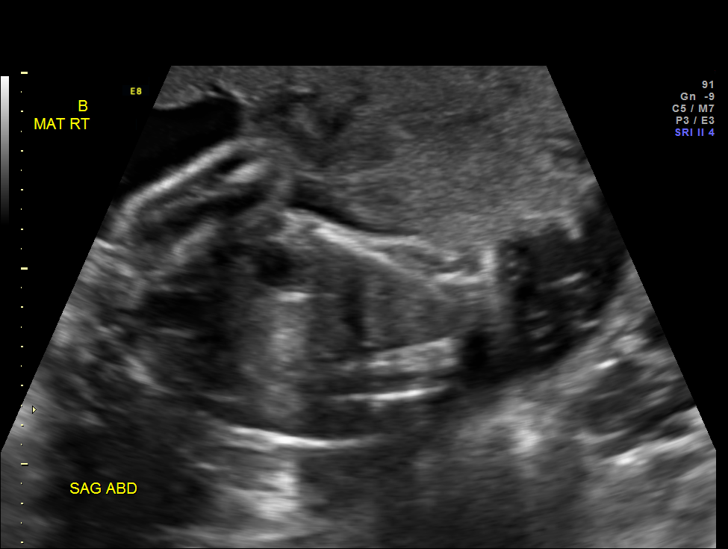
[im 18/20]
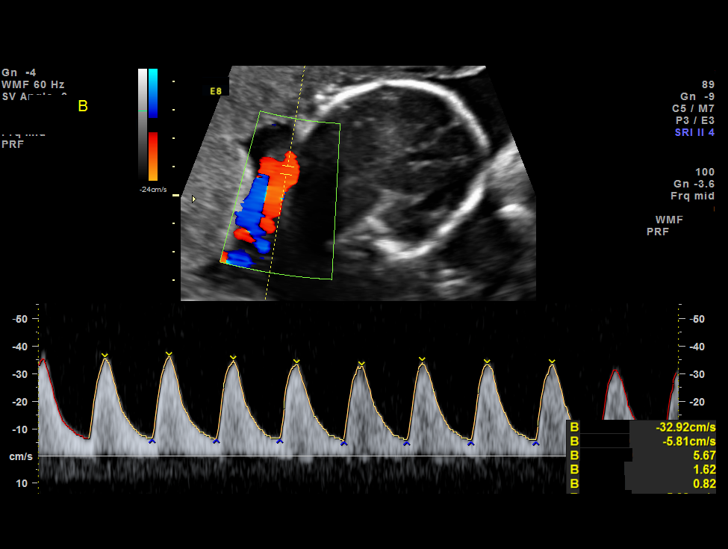
[im 20/20]
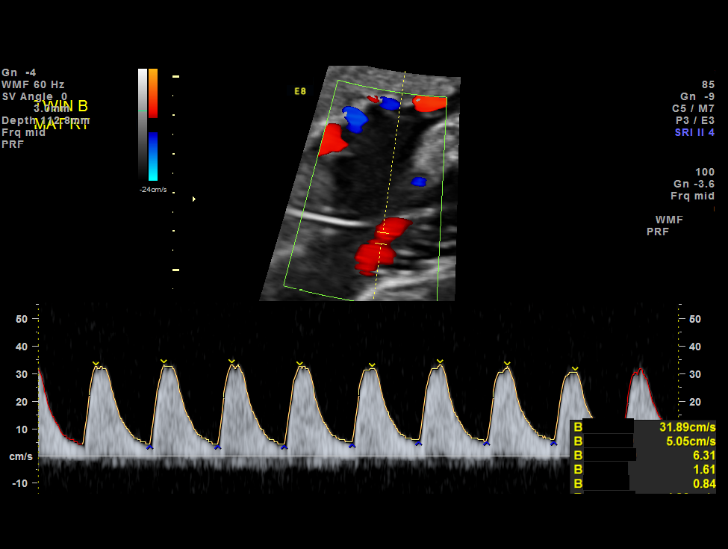

[12 of 20 positions shown; findings below may reference images not displayed]

OBSTETRICS REPORT
                      (Signed Final 06/23/2012 [DATE])

 Order#:         [id number]
                 [id number]
                 [id number]
Procedures

 US UA CORD DOPPLER                                    76827.2
 US UA DOPPLER ADDL GEST RE EVAL                       76828.2
Indications

 Intrauterine Growth Restriction - Twin B
 Twin gestation, [place]
 Advanced maternal age (AMA), Multigravida (36)
 RH Negative
 Assess fetal well being
Fetal Evaluation (Fetus A)

 Fetal Heart Rate:  148                          bpm
 Cardiac Activity:  Observed
 Fetal Lie:         Left Fetus
 Presentation:      Cephalic
 Placenta:          Anterior, above cervical os
 P. Cord            Previously Visualized
 Insertion:

 Membrane Desc:     Dividing
                    Membrane seen
                    - Monochorionic

 Amniotic Fluid
 AFI FV:      Subjectively within normal limits
                                             Larg Pckt:     7.0  cm
Biophysical Evaluation (Fetus A)

 Amniotic F.V:   Pocket => 2 cm two         F. Tone:        Observed
                 planes
 F. Movement:    Observed                   Score:          [DATE]
 F. Breathing:   Observed
Gestational Age (Fetus A)
 LMP:           30w 3d        Date:  11/23/11                 EDD:   08/29/12
 Best:          30w 3d     Det. By:  LMP  (11/23/11)          EDD:   08/29/12
Doppler - Fetal Vessels (Fetus A)

 Umbilical Artery
 S/D:   2.85           52  %tile       RI:
 PI:    0.98                           PSV:       52.65   cm/s
 Umbilical Artery
 Absent DFV:    No     Reverse DFV:    No

Fetal Evaluation (Fetus B)

 Fetal Heart Rate:  148                          bpm
 Cardiac Activity:  Observed
 Fetal Lie:         Right Fetus
 Presentation:      Cephalic
 Placenta:          Anterior, above cervical os
 P. Cord            Previously seen as
 Insertion:         marginal

 Membrane Desc:     Dividing
                    Membrane seen
                    - Monochorionic

 Amniotic Fluid
 AFI FV:      Subjectively low-normal
                                             Larg Pckt:     2.3  cm
Biophysical Evaluation (Fetus B)

 Amniotic F.V:   Pocket => 2 cm two         F. Tone:        Observed
                 planes
 F. Movement:    Observed                   Score:          [DATE]
 F. Breathing:   Not Observed
Gestational Age (Fetus B)

 LMP:           30w 3d        Date:  11/23/11                 EDD:   08/29/12
 Best:          30w 3d     Det. By:  LMP  (11/23/11)          EDD:   08/29/12
Doppler - Fetal Vessels (Fetus B)

 Umbilical Artery
 S/D:   6.87       > 97.5  %tile       RI:
 PI:    1.73                           PSV:       35.37   cm/s
 Umbilical Artery
 Absent DFV:    No     Reverse DFV:    No

Impression

 Monochorionic/diamniotic twin pregnancy at 30+3 weeks
 Twin A:
 Normal amniotic fluid volume
 UA dopplers were normal for this GA
 BPP [DATE]
 Twin B:
 Low normal amniotic fluid volume
 UA dopplers were elevated for this GA (> 97.5% tile) but with
 diastolic flow throughout.
 BPP [DATE]  (-2 for lack of breathing movement; reactive NST)
Recommendations

 Continue UA dopplers three times per week; BPPs twice
 weekly
 Follow up growth scan in later this week

## 2014-09-06 ENCOUNTER — Encounter (HOSPITAL_COMMUNITY): Payer: Self-pay

## 2017-10-18 ENCOUNTER — Other Ambulatory Visit: Payer: Self-pay | Admitting: Obstetrics and Gynecology

## 2017-10-24 ENCOUNTER — Encounter (HOSPITAL_COMMUNITY): Payer: Self-pay | Admitting: *Deleted

## 2017-10-31 ENCOUNTER — Other Ambulatory Visit: Payer: Self-pay

## 2017-10-31 ENCOUNTER — Encounter (HOSPITAL_COMMUNITY): Payer: Self-pay

## 2017-11-11 ENCOUNTER — Encounter (HOSPITAL_COMMUNITY): Payer: Self-pay | Admitting: Anesthesiology

## 2017-11-12 ENCOUNTER — Ambulatory Visit (HOSPITAL_COMMUNITY)
Admission: RE | Admit: 2017-11-12 | Payer: BLUE CROSS/BLUE SHIELD | Source: Ambulatory Visit | Admitting: Obstetrics and Gynecology

## 2017-11-12 SURGERY — DILATATION & CURETTAGE/HYSTEROSCOPY WITH NOVASURE ABLATION
Anesthesia: Choice

## 2017-11-12 MED ORDER — CEFAZOLIN SODIUM-DEXTROSE 2-4 GM/100ML-% IV SOLN
2.0000 g | INTRAVENOUS | Status: AC
  Filled 2017-11-12: qty 100

## 2017-11-12 NOTE — Anesthesia Preprocedure Evaluation (Deleted)
Anesthesia Evaluation  Patient identified by MRN, date of birth, ID band Patient awake    Reviewed: Allergy & Precautions, H&P , Patient's Chart, lab work & pertinent test results, reviewed documented beta blocker date and time   Airway Mallampati: II  TM Distance: >3 FB Neck ROM: full    Dental no notable dental hx.    Pulmonary    Pulmonary exam normal breath sounds clear to auscultation       Cardiovascular  Rhythm:regular Rate:Normal     Neuro/Psych    GI/Hepatic   Endo/Other    Renal/GU      Musculoskeletal   Abdominal   Peds  Hematology   Anesthesia Other Findings   Reproductive/Obstetrics                             Anesthesia Physical Anesthesia Plan  ASA: II  Anesthesia Plan: General   Post-op Pain Management:    Induction: Intravenous  PONV Risk Score and Plan:   Airway Management Planned: LMA  Additional Equipment:   Intra-op Plan:   Post-operative Plan:   Informed Consent: I have reviewed the patients History and Physical, chart, labs and discussed the procedure including the risks, benefits and alternatives for the proposed anesthesia with the patient or authorized representative who has indicated his/her understanding and acceptance.   Dental Advisory Given  Plan Discussed with: CRNA and Surgeon  Anesthesia Plan Comments: ( )        Anesthesia Quick Evaluation  

## 2017-11-12 NOTE — H&P (Signed)
NAMJanann Adams:  Adams, [name]              ACCOUNT NO.:  [phone]  MEDICAL RECORD NO.:  [id number]  LOCATION:                                 FACILITY:  PHYSICIAN:  Lenoard Adenichard J. Lynn Sissel, M.D.     DATE OF BIRTH:  DATE OF ADMISSION: DATE OF DISCHARGE:                             HISTORY & PHYSICAL   DATE OF SURGERY:  11/12/2017.  CHIEF COMPLAINT:  Refractory menorrhagia with secondary anemia.  HISTORY OF PRESENT ILLNESS:  A 42 year old white female, G3, P2, who presents now for diagnostic hysteroscopy and endometrial ablation.  ALLERGIES:  She has no known drug allergies.  MEDICATIONS:  No medications.  SOCIAL HISTORY:  Nonsmoker, nondrinker.  Denies domestic or physical violence.  PAST SURGICAL HISTORY: 1. History of vaginal delivery and a C-section. 2. History of tubal ligation. 3. History of blood transfusion. 4. History of hip surgery.  FAMILY HISTORY:  Colon cancer, diabetes, hypertension, and heart disease.  PHYSICAL EXAMINATION:  GENERAL:  She is a well-developed, well-nourished white female, in no acute distress. HEENT:  Normal. NECK:  Supple.  Full range of motion. LUNGS:  Clear. HEART:  Regular rate and rhythm. ABDOMEN:  Soft and nontender. PELVIC:  Reveals uterus to be boggy, anteflexed, and no adnexal masses. EXTREMITIES:  There are no cords. NEUROLOGIC:  Nonfocal. SKIN:  Intact.  IMPRESSION:  Refractory menorrhagia with secondary anemia.  PLAN:  Diagnostic hysteroscopy, D and C, and NovaSure endometrial ablation.  Risks of anesthesia, infection, bleeding, injury to surrounding organs, possible need for repair discussed, delayed versus immediate complications to include bowel and bladder injury noted.  The patient acknowledges and wishes to proceed.     Lenoard Adenichard J. [name], M.D.     RJT/MEDQ  D:  11/11/2017  T:  11/11/2017  Job:  [id number]

## 2021-06-30 ENCOUNTER — Other Ambulatory Visit: Payer: Self-pay | Admitting: Nephrology

## 2021-06-30 DIAGNOSIS — R7989 Other specified abnormal findings of blood chemistry: Secondary | ICD-10-CM

## 2021-06-30 DIAGNOSIS — R030 Elevated blood-pressure reading, without diagnosis of hypertension: Secondary | ICD-10-CM
